# Patient Record
Sex: Female | Born: 1990
Health system: Southern US, Community
[De-identification: ages and names within clinical notes are randomized; demographics above are authoritative.]

## PROBLEM LIST (undated history)

## (undated) DIAGNOSIS — A749 Chlamydial infection, unspecified: Principal | ICD-10-CM

## (undated) DIAGNOSIS — Z8619 Personal history of other infectious and parasitic diseases: Secondary | ICD-10-CM

## (undated) HISTORY — DX: Chlamydial infection, unspecified: A74.9

## (undated) HISTORY — DX: Personal history of other infectious and parasitic diseases: Z86.19

---
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
## 2012-06-04 LAB — OB RESULTS CONSOLE ANTIBODY SCREEN: Antibody Screen: NEGATIVE

## 2012-06-04 LAB — OB RESULTS CONSOLE HEPATITIS B SURFACE ANTIGEN: Hepatitis B Surface Ag: NEGATIVE

## 2012-06-04 LAB — OB RESULTS CONSOLE VARICELLA ZOSTER ANTIBODY, IGG: Varicella: IMMUNE

## 2012-06-04 LAB — OB RESULTS CONSOLE ABO/RH: RH Type: POSITIVE

## 2012-06-04 LAB — OB RESULTS CONSOLE RUBELLA ANTIBODY, IGM: Rubella: IMMUNE

## 2012-06-04 LAB — OB RESULTS CONSOLE RPR: RPR: NONREACTIVE

## 2012-06-04 LAB — OB RESULTS CONSOLE HIV ANTIBODY (ROUTINE TESTING): HIV: NONREACTIVE

## 2012-06-04 LAB — OB RESULTS CONSOLE GC/CHLAMYDIA
Chlamydia: POSITIVE
Gonorrhea: NEGATIVE

## 2012-06-04 LAB — CYSTIC FIBROSIS DIAGNOSTIC STUDY: Interpretation-CFDNA:: NEGATIVE

## 2012-07-17 NOTE — L&D Delivery Note (Signed)
Delivery Note  Delivery team presented to pt bedside after variable decels with contractions noted on monitor. Pt attempted several pushes w/o significant movement of the infant, though she did make progress. Fetal Hr continued to show variable decels with contractions with recovery to 90-100s between contractions.   Decision was made to utilize kiwi vacuum for expediting delivery.  Dr Penne Lash was consulted to come for delivery.  Kiwi vacuum was applied to vertex and checked to assure application.   After 4 pulls of the vacuum w/ no pop offs and at 11:54 AM a viable female was delivered via Vaginal, Vacuum (Extractor) (Presentation: ; Occiput Posterior).    Dr. Penne Lash was present for the delivery. Cord was clamped and cut and baby was taken to warmer where RN assessed condition. Baby cried spontaneously and was vigorous.   APGAR: 8, 9; weight pending .   Placenta status: Intact, Spontaneous.  Cord: 3 vessels with the following complications: None.  Cord pH: pending  Anesthesia: Epidural  Episiotomy: None Lacerations: 1st degree midline perineal Suture Repair: 3.0 vicryl Est. Blood Loss (mL): 450  Mom to postpartum.  Baby to nursery-stable.  MERRELL, DAVID, MD Family Medicine Resident PGY-2 10/28/2012, 12:27 PM  I applied the vacuum and performed the delivery of the head, with Dr Penne Lash supervising. Then Dr Margot Ables completed the delivery without incident.  Agree with note.   Wynelle Bourgeois CNM   Vacuum was already applied to head upon my arrival to the room.  Baby delivered shortly thereafter (one to two pushes).  Daven Pinckney H.

## 2012-09-18 ENCOUNTER — Encounter: Payer: Self-pay | Admitting: *Deleted

## 2012-10-16 ENCOUNTER — Other Ambulatory Visit: Payer: Self-pay

## 2012-10-16 ENCOUNTER — Encounter: Payer: Self-pay | Admitting: Obstetrics & Gynecology

## 2012-10-16 ENCOUNTER — Encounter: Payer: Self-pay | Admitting: *Deleted

## 2012-10-17 ENCOUNTER — Encounter: Payer: Self-pay | Admitting: *Deleted

## 2012-10-17 ENCOUNTER — Encounter: Payer: Self-pay | Admitting: Obstetrics & Gynecology

## 2012-10-17 ENCOUNTER — Other Ambulatory Visit: Payer: Self-pay

## 2012-10-22 ENCOUNTER — Ambulatory Visit (INDEPENDENT_AMBULATORY_CARE_PROVIDER_SITE_OTHER): Payer: Medicaid Other

## 2012-10-22 ENCOUNTER — Other Ambulatory Visit: Payer: Self-pay | Admitting: Advanced Practice Midwife

## 2012-10-22 ENCOUNTER — Ambulatory Visit (INDEPENDENT_AMBULATORY_CARE_PROVIDER_SITE_OTHER): Payer: Medicaid Other | Admitting: Advanced Practice Midwife

## 2012-10-22 VITALS — BP 144/80 | Wt 155.6 lb

## 2012-10-22 DIAGNOSIS — Z1389 Encounter for screening for other disorder: Secondary | ICD-10-CM

## 2012-10-22 DIAGNOSIS — O288 Other abnormal findings on antenatal screening of mother: Secondary | ICD-10-CM

## 2012-10-22 DIAGNOSIS — O365931 Maternal care for other known or suspected poor fetal growth, third trimester, fetus 1: Secondary | ICD-10-CM

## 2012-10-22 DIAGNOSIS — O4100X0 Oligohydramnios, unspecified trimester, not applicable or unspecified: Secondary | ICD-10-CM

## 2012-10-22 DIAGNOSIS — IMO0002 Reserved for concepts with insufficient information to code with codable children: Secondary | ICD-10-CM | POA: Insufficient documentation

## 2012-10-22 DIAGNOSIS — Z3490 Encounter for supervision of normal pregnancy, unspecified, unspecified trimester: Secondary | ICD-10-CM

## 2012-10-22 DIAGNOSIS — O4103X1 Oligohydramnios, third trimester, fetus 1: Secondary | ICD-10-CM

## 2012-10-22 DIAGNOSIS — O365990 Maternal care for other known or suspected poor fetal growth, unspecified trimester, not applicable or unspecified: Secondary | ICD-10-CM

## 2012-10-22 DIAGNOSIS — Z3480 Encounter for supervision of other normal pregnancy, unspecified trimester: Secondary | ICD-10-CM

## 2012-10-22 DIAGNOSIS — O0933 Supervision of pregnancy with insufficient antenatal care, third trimester: Secondary | ICD-10-CM

## 2012-10-22 DIAGNOSIS — O0930 Supervision of pregnancy with insufficient antenatal care, unspecified trimester: Secondary | ICD-10-CM

## 2012-10-22 DIAGNOSIS — Z331 Pregnant state, incidental: Secondary | ICD-10-CM

## 2012-10-22 LAB — POCT URINALYSIS DIPSTICK
Glucose, UA: NEGATIVE
Ketones, UA: NEGATIVE
Nitrite, UA: NEGATIVE
Protein, UA: NEGATIVE

## 2012-10-22 LAB — OB RESULTS CONSOLE GC/CHLAMYDIA
CHLAMYDIA, DNA PROBE: NEGATIVE
Gonorrhea: NEGATIVE

## 2012-10-22 NOTE — Progress Notes (Signed)
C/o pressure in pelvic area and [employer]

## 2012-10-22 NOTE — Progress Notes (Signed)
U/S-37+6wks-vtx active fetus BPP 8/8, Mild Olgo. AFI=4.8cm, ant gr 2 plac, IUGR EFW 5 lb 11 oz (2589 gms) 7th%tile, UA Doppler RI=.66 & .14, female fetus [place]")

## 2012-10-22 NOTE — Assessment & Plan Note (Addendum)
Clinic:[facility]  Genetic Screen NT:                            First Screen:               Quad Screen/MSAFP:normal  Anatomic [place] normal  Glucose Screen 4/10  GBS 4/8  Feeding Preference breast  Contraception depo  Circumcision Yes @ [facility]

## 2012-10-22 NOTE — Progress Notes (Signed)
Hasn't had care since January (transportation issues).  Size<dates.  U/S done today for EFW (see note)  B/P up a little.  Denies HA, visual changes, RUQ pain. Will check PreX labs.  Routine questions about pregnancy answered.  F/U Thurs for PN2 (one hour since appt is in afternoon), NST and B/P recheck.  Discussed with Dr Despina Hidden

## 2012-10-23 ENCOUNTER — Telehealth: Payer: Self-pay | Admitting: Adult Health

## 2012-10-23 LAB — US OB FOLLOW UP

## 2012-10-23 LAB — CBC
HCT: 27.5 % — ABNORMAL LOW (ref 36.0–46.0)
Hemoglobin: 9.4 g/dL — ABNORMAL LOW (ref 12.0–15.0)
MCH: 27.7 pg (ref 26.0–34.0)
MCHC: 34.2 g/dL (ref 30.0–36.0)
MCV: 81.1 fL (ref 78.0–100.0)
Platelets: 368 10*3/uL (ref 150–400)
RBC: 3.39 MIL/uL — ABNORMAL LOW (ref 3.87–5.11)
RDW: 14.1 % (ref 11.5–15.5)
WBC: 6.7 10*3/uL (ref 4.0–10.5)

## 2012-10-23 LAB — COMPREHENSIVE METABOLIC PANEL
ALT: 18 U/L (ref 0–35)
AST: 26 U/L (ref 0–37)
Albumin: 3.2 g/dL — ABNORMAL LOW (ref 3.5–5.2)
Alkaline Phosphatase: 126 U/L — ABNORMAL HIGH (ref 39–117)
BUN: 3 mg/dL — ABNORMAL LOW (ref 6–23)
CO2: 28 mEq/L (ref 19–32)
Calcium: 8.7 mg/dL (ref 8.4–10.5)
Chloride: 105 mEq/L (ref 96–112)
Creat: 0.53 mg/dL (ref 0.50–1.10)
Glucose, Bld: 74 mg/dL (ref 70–99)
Potassium: 2.8 mEq/L — ABNORMAL LOW (ref 3.5–5.3)
Sodium: 140 mEq/L (ref 135–145)
Total Bilirubin: 0.3 mg/dL (ref 0.3–1.2)
Total Protein: 5.8 g/dL — ABNORMAL LOW (ref 6.0–8.3)

## 2012-10-23 LAB — PROTEIN / CREATININE RATIO, URINE
Creatinine, Urine: 27.2 mg/dL
Protein Creatinine Ratio: 0.11 (ref <0.15)
Total Protein, Urine: 3 mg/dL

## 2012-10-23 LAB — GC/CHLAMYDIA PROBE AMP
CT Probe RNA: NEGATIVE
GC Probe RNA: NEGATIVE

## 2012-10-23 NOTE — Telephone Encounter (Signed)
Called pt. Regarding labs. Potassium 2.8 told to get OTC potassium and take 2-3 x daily  And HGB 9.4 to take Prenatal vitamin daily and get OTC Iron supplement and take daily. Keep appointment for 4/10 @2 :45 pm. Discussed with Dr. Despina Hidden.

## 2012-10-24 ENCOUNTER — Other Ambulatory Visit: Payer: Medicaid Other

## 2012-10-24 ENCOUNTER — Encounter: Payer: Medicaid Other | Admitting: Obstetrics & Gynecology

## 2012-10-25 ENCOUNTER — Ambulatory Visit (INDEPENDENT_AMBULATORY_CARE_PROVIDER_SITE_OTHER): Payer: Medicaid Other | Admitting: Obstetrics & Gynecology

## 2012-10-25 ENCOUNTER — Other Ambulatory Visit: Payer: Medicaid Other

## 2012-10-25 VITALS — BP 118/88 | Wt 154.0 lb

## 2012-10-25 DIAGNOSIS — O365990 Maternal care for other known or suspected poor fetal growth, unspecified trimester, not applicable or unspecified: Secondary | ICD-10-CM

## 2012-10-25 DIAGNOSIS — O4100X0 Oligohydramnios, unspecified trimester, not applicable or unspecified: Secondary | ICD-10-CM

## 2012-10-25 DIAGNOSIS — Z331 Pregnant state, incidental: Secondary | ICD-10-CM

## 2012-10-25 DIAGNOSIS — O0930 Supervision of pregnancy with insufficient antenatal care, unspecified trimester: Secondary | ICD-10-CM

## 2012-10-25 DIAGNOSIS — O0990 Supervision of high risk pregnancy, unspecified, unspecified trimester: Secondary | ICD-10-CM | POA: Insufficient documentation

## 2012-10-25 DIAGNOSIS — Z1389 Encounter for screening for other disorder: Secondary | ICD-10-CM

## 2012-10-25 LAB — POCT URINALYSIS DIPSTICK
Blood, UA: NEGATIVE
Glucose, UA: NEGATIVE
Ketones, UA: NEGATIVE
Nitrite, UA: NEGATIVE
Protein, UA: NEGATIVE

## 2012-10-25 LAB — CBC
HCT: 27.1 % — ABNORMAL LOW (ref 36.0–46.0)
Hemoglobin: 9 g/dL — ABNORMAL LOW (ref 12.0–15.0)
MCH: 28.1 pg (ref 26.0–34.0)
MCHC: 33.2 g/dL (ref 30.0–36.0)
MCV: 84.7 fL (ref 78.0–100.0)
Platelets: 361 10*3/uL (ref 150–400)
RBC: 3.2 MIL/uL — ABNORMAL LOW (ref 3.87–5.11)
RDW: 14.4 % (ref 11.5–15.5)
WBC: 6.4 10*3/uL (ref 4.0–10.5)

## 2012-10-25 NOTE — Progress Notes (Signed)
C/o pelvic pressure and braxton hicks.

## 2012-10-25 NOTE — Patient Instructions (Signed)
Epidural Risks and Benefits The continuous putting in (infusion) of local anesthetics through a long, narrow, hollow plastic tube (catheter)/needle into the lower (lumbar) area of your spine is commonly called an epidural. This means outside the covering of the spinal cord. The epidural catheter is placed in the space on the outside of the membrane that covers the spinal cord. The anesthetic medicine numbs the nerves of the spinal cord in the epidural space. There is also a spinal/epidural anesthetic using two needles and a catheter. The medication is first placed in the spinal canal. Then that needle is removed and a catheter is placed in the epidural space through the second needle for continuous anesthesia. This seems to be the most popular type of regional anesthesia used now. This is sometimes given for pain management to women who are giving birth. Spinal and epidural anesthesia are called regional anesthesia because they numb a certain region of the body. While it is an effective pain management tool, some reasons not to use this include:  Restricted mobility: The tubes and monitors connected to you do not allow for much moving around.  Increased likelihood of bladder catheterization, oxytocin administration, and internal monitoring. This means a tube (catheter) may have to be put into the bladder to drain the urine. Uterine contractions can become weaker and less frequent. They also may have a higher use of oxytocin than mothers not having regional anesthesia.  Increased likelihood of operative delivery: This includes the use of or need for forceps, vacuum extractor, episiotomy, or cesarean delivery. When the dose is too large, or when it sinks down into the "tailbone" (sacral) region of the body, the perineum and the birth canal (vagina) are anesthetized. Anesthetic is injected into this area late in labor to deaden all sensation. When it "accidentally" happens earlier in labor, the muscles of the  pelvic floor are relaxed too early. This interferes with the normal flexion and rotation of the baby's head as it passes through the birth canal. This interference can lead to abnormal presentations that are more dangerous for the baby.  Must use an automatic blood pressure cuff throughout labor. This is a cuff that automatically takes your blood pressure at regular intervals. SHORT TERM MATERNAL RISKS  Dural puncture - The dura is one of the membranes surrounding the spinal cord. If the anesthetic medication gets into the spinal canal through a dural puncture, it can result in a spinal anesthetic and spinal headache. Spinal headaches are treated with an epidural blood patch to cover the punctured area.  Low blood pressure (hypotension) - Nearly one third of women with an epidural will develop low blood pressure. The ways that patients must lay during the epidural can make this worse. Their position is limited because they will be unable to move their legs easily for the time of the anesthetic. Low blood pressure is also a risk for the baby. If the baby does not get enough oxygen from the mom's blood, it can result in an emergency Cesarean section. This means the baby is delivered by an operation through a cut by the surgeon (incision) on the belly of the mother.  Nausea, vomiting, and prolonged shivering.  Prolonged labor - With large doses of anesthetic medication, the patient loses the desire and the ability to bear down and push. This results in an increased use of forceps and vacuum extractions, compared to women having unmedicated deliveries.  Uneven, incomplete or non-existent pain relief. Sometimes the epidural does not work well and   additional medications may be needed for pain relief.  Difficulty breathing well or paralysis if the level of anesthesia goes too high in the spine.  Convulsions - If the anesthetic agent accidentally is injected into a blood vessel it can cause convulsions and  loss of consciousness.  Toxic drug reactions.  Septic meningitis - An abscess can form at the site where the epidural catheter is placed. If this spreads into the spinal canal it can cause meningitis.  Allergic reaction - This causes blood pressure to become too low and other medications and fluids must be given to bring the blood pressure up. Also rashes and difficulty breathing may develop.  Cardiac arrest - This is rare but real threat to the life of the mother and baby.  Fever is common.  Itching that is easily treated.  Spinal hematoma. LONG TERM MATERNAL RISKS  Neurological complications - A nerve problem called Horner's syndrome can develop with epidural anesthesia for vaginal delivery. It is impossible to predict which patients will develop a Horner's syndrome. Even the nerves to the face can be blocked, temporarily or permanently. Tremors and shakes can occur.  Paresthesia ("pins and needles"). This is a feeling that comes from inflammation of a nerve.  Dizziness and fainting can become a problem after epidurals. This is usually only for a couple of days. RISKS TO BABY  Direct drug toxicity.  Fetal distress, abnormal fetal heart rate (FHR) (can lead to emergency cesarean). This is especially true if the anesthetic gets into the mother's blood stream or too much medication is put into the epidural. REASONS NOT TO HAVE EPIDURAL ANESTHESIA  Increased costs.  The mother has a low blood pressure.  There are blood clotting problems.  A brain tumor is present.  There is an infection in the blood stream.  A skin infection at the needle site.  A tattoo at the needle site. BENEFITS  Regional anesthesia is the most effective pain relief for labor and delivery.  It is the best anesthetic for preeclampsia and eclampsia.  There is better pain control after delivery (vaginal or cesarean).  When done correctly, no medication gets to the baby.  Sooner ambulation after  delivery.  It can be left in place during all of labor.  You can be awake during a Cesarean delivery and see the baby immediately after delivery. AFTER THE PROCEDURE   You will be kept in bed for several hours to prevent headaches.  You will be kept in bed until your legs are no longer numb and it is safe to walk.  The length of time you spend in the hospital will depend on the type of surgery or procedure you have had.  The epidural catheter is removed after you no longer need it for pain. HOME CARE INSTRUCTIONS   Do not drive or operate any kind of machinery for at least 24 hours. Make sure there is someone to drive you home.  Do not drink alcohol for at least 24 hours after the anesthesia.  Do not make important decisions for at least 24 hours after the anesthesia.  Drink lots of fluids.  Return to your normal diet.  Keep all your postoperative appointments as scheduled. SEEK IMMEDIATE MEDICAL CARE IF:  You develop a fever or temperature over 98.6 F (37 C).  You have a persistent headache.  You develop dizziness, fainting or lightheadedness.  You develop weakness, numbness or tingling in your arms or legs.  You have a skin rash.  You   have difficulty breathing  You have a stiff neck with or without stiff back.  You develop chest pain. Document Released: 07/03/2005 Document Revised: 09/25/2011 Document Reviewed: 08/10/2008 ExitCare Patient Information 2013 [employer].  

## 2012-10-25 NOTE — Progress Notes (Signed)
Reactive NST.  BP weight and urine results all reviewed and noted. Patient reports good fetal movement, denies any bleeding and no rupture of membranes symptoms or regular contractions. Patient is without complaints. All questions were answered. Sonogram on Tues 4.15.2014

## 2012-10-26 LAB — RPR

## 2012-10-26 LAB — GLUCOSE TOLERANCE, 1 HOUR

## 2012-10-26 LAB — ANTIBODY SCREEN: Antibody Screen: NEGATIVE

## 2012-10-26 LAB — HIV ANTIBODY (ROUTINE TESTING W REFLEX): HIV: NONREACTIVE

## 2012-10-28 ENCOUNTER — Inpatient Hospital Stay (HOSPITAL_COMMUNITY): Payer: Medicaid Other | Admitting: Anesthesiology

## 2012-10-28 ENCOUNTER — Inpatient Hospital Stay (HOSPITAL_COMMUNITY)
Admission: AD | Admit: 2012-10-28 | Discharge: 2012-10-30 | DRG: 775 | Disposition: A | Discharge status: Home / Self Care | Payer: Medicaid Other | Source: Ambulatory Visit | Attending: Obstetrics and Gynecology | Admitting: Obstetrics and Gynecology

## 2012-10-28 ENCOUNTER — Encounter (HOSPITAL_COMMUNITY): Payer: Self-pay

## 2012-10-28 ENCOUNTER — Encounter (HOSPITAL_COMMUNITY): Payer: Self-pay | Admitting: Anesthesiology

## 2012-10-28 DIAGNOSIS — O365990 Maternal care for other known or suspected poor fetal growth, unspecified trimester, not applicable or unspecified: Secondary | ICD-10-CM | POA: Diagnosis present

## 2012-10-28 DIAGNOSIS — O0990 Supervision of high risk pregnancy, unspecified, unspecified trimester: Secondary | ICD-10-CM

## 2012-10-28 DIAGNOSIS — IMO0002 Reserved for concepts with insufficient information to code with codable children: Secondary | ICD-10-CM

## 2012-10-28 DIAGNOSIS — O288 Other abnormal findings on antenatal screening of mother: Secondary | ICD-10-CM

## 2012-10-28 DIAGNOSIS — O4100X0 Oligohydramnios, unspecified trimester, not applicable or unspecified: Secondary | ICD-10-CM

## 2012-10-28 DIAGNOSIS — O0930 Supervision of pregnancy with insufficient antenatal care, unspecified trimester: Secondary | ICD-10-CM

## 2012-10-28 LAB — CBC
Hemoglobin: 8.9 g/dL — ABNORMAL LOW (ref 12.0–15.0)
MCH: 28.7 pg (ref 26.0–34.0)
MCHC: 33.6 g/dL (ref 30.0–36.0)
Platelets: 289 10*3/uL (ref 150–400)

## 2012-10-28 LAB — HSV 2 ANTIBODY, IGG: HSV 2 Glycoprotein G Ab, IgG: <0.1 IV

## 2012-10-28 LAB — RPR: RPR Ser Ql: NONREACTIVE

## 2012-10-28 MED ORDER — DIPHENHYDRAMINE HCL 25 MG PO CAPS: 25.0000 mg | ORAL_CAPSULE | Freq: Four times a day (QID) | ORAL | Status: DC | PRN

## 2012-10-28 MED ORDER — LIDOCAINE HCL (PF) 1 % IJ SOLN: 30.0000 mL | INTRAMUSCULAR | Status: DC | PRN

## 2012-10-28 MED ORDER — LACTATED RINGERS IV SOLN
500.0000 mL | Freq: Once | INTRAVENOUS | Status: AC
  Administered 2012-10-28: 500 mL via INTRAVENOUS

## 2012-10-28 MED ORDER — ONDANSETRON HCL 4 MG/2ML IJ SOLN: 4.0000 mg | Freq: Four times a day (QID) | INTRAMUSCULAR | Status: DC | PRN

## 2012-10-28 MED ORDER — ACETAMINOPHEN 325 MG PO TABS: 650.0000 mg | ORAL_TABLET | ORAL | Status: DC | PRN

## 2012-10-28 MED ORDER — FENTANYL 2.5 MCG/ML BUPIVACAINE 1/10 % EPIDURAL INFUSION (WH - ANES)
INTRAMUSCULAR | Status: DC | PRN
  Administered 2012-10-28: 14 mL/h via EPIDURAL

## 2012-10-28 MED ORDER — FENTANYL 2.5 MCG/ML BUPIVACAINE 1/10 % EPIDURAL INFUSION (WH - ANES): 14.0000 mL/h | INTRAMUSCULAR | Status: DC | PRN

## 2012-10-28 MED ORDER — LIDOCAINE HCL (PF) 1 % IJ SOLN
INTRAMUSCULAR | Status: DC | PRN
  Administered 2012-10-28 (×2): 4 mL

## 2012-10-28 MED ORDER — WITCH HAZEL-GLYCERIN EX PADS: 1.0000 "application " | MEDICATED_PAD | CUTANEOUS | Status: DC | PRN

## 2012-10-28 MED ORDER — FENTANYL 2.5 MCG/ML BUPIVACAINE 1/10 % EPIDURAL INFUSION (WH - ANES)
14.0000 mL/h | INTRAMUSCULAR | Status: DC | PRN
  Filled 2012-10-28: qty 125

## 2012-10-28 MED ORDER — SENNOSIDES-DOCUSATE SODIUM 8.6-50 MG PO TABS
2.0000 | ORAL_TABLET | Freq: Every day | ORAL | Status: DC
  Administered 2012-10-28 – 2012-10-29 (×2): 2 via ORAL

## 2012-10-28 MED ORDER — OXYTOCIN BOLUS FROM INFUSION: 500.0000 mL | INTRAVENOUS | Status: DC

## 2012-10-28 MED ORDER — OXYTOCIN 40 UNITS IN LACTATED RINGERS INFUSION - SIMPLE MED
62.5000 mL/h | INTRAVENOUS | Status: DC
  Filled 2012-10-28: qty 1000

## 2012-10-28 MED ORDER — CITRIC ACID-SODIUM CITRATE 334-500 MG/5ML PO SOLN: 30.0000 mL | ORAL | Status: DC | PRN

## 2012-10-28 MED ORDER — ZOLPIDEM TARTRATE 5 MG PO TABS: 5.0000 mg | ORAL_TABLET | Freq: Every evening | ORAL | Status: DC | PRN

## 2012-10-28 MED ORDER — PRENATAL MULTIVITAMIN CH
1.0000 | ORAL_TABLET | Freq: Every day | ORAL | Status: DC
  Administered 2012-10-28: 1 via ORAL
  Filled 2012-10-28 (×2): qty 1

## 2012-10-28 MED ORDER — OXYCODONE-ACETAMINOPHEN 5-325 MG PO TABS: 1.0000 | ORAL_TABLET | ORAL | Status: DC | PRN

## 2012-10-28 MED ORDER — LANOLIN HYDROUS EX OINT: TOPICAL_OINTMENT | CUTANEOUS | Status: DC | PRN

## 2012-10-28 MED ORDER — ONDANSETRON HCL 4 MG PO TABS: 4.0000 mg | ORAL_TABLET | ORAL | Status: DC | PRN

## 2012-10-28 MED ORDER — NALBUPHINE SYRINGE 5 MG/0.5 ML: 5.0000 mg | INJECTION | INTRAMUSCULAR | Status: DC | PRN

## 2012-10-28 MED ORDER — TETANUS-DIPHTH-ACELL PERTUSSIS 5-2.5-18.5 LF-MCG/0.5 IM SUSP
0.5000 mL | Freq: Once | INTRAMUSCULAR | Status: AC
  Administered 2012-10-29: 0.5 mL via INTRAMUSCULAR
  Filled 2012-10-28: qty 0.5

## 2012-10-28 MED ORDER — DIPHENHYDRAMINE HCL 50 MG/ML IJ SOLN: 12.5000 mg | INTRAMUSCULAR | Status: DC | PRN

## 2012-10-28 MED ORDER — EPHEDRINE 5 MG/ML INJ
10.0000 mg | INTRAVENOUS | Status: DC | PRN
  Filled 2012-10-28: qty 4

## 2012-10-28 MED ORDER — IBUPROFEN 600 MG PO TABS
600.0000 mg | ORAL_TABLET | Freq: Four times a day (QID) | ORAL | Status: DC
  Administered 2012-10-28 – 2012-10-30 (×7): 600 mg via ORAL
  Filled 2012-10-28 (×7): qty 1

## 2012-10-28 MED ORDER — PHENYLEPHRINE 40 MCG/ML (10ML) SYRINGE FOR IV PUSH (FOR BLOOD PRESSURE SUPPORT): 80.0000 ug | PREFILLED_SYRINGE | INTRAVENOUS | Status: DC | PRN

## 2012-10-28 MED ORDER — EPHEDRINE 5 MG/ML INJ: 10.0000 mg | INTRAVENOUS | Status: DC | PRN

## 2012-10-28 MED ORDER — DIBUCAINE 1 % RE OINT
1.0000 "application " | TOPICAL_OINTMENT | RECTAL | Status: DC | PRN
  Filled 2012-10-28: qty 28

## 2012-10-28 MED ORDER — LACTATED RINGERS IV SOLN
INTRAVENOUS | Status: DC
  Administered 2012-10-28: 10:00:00 via INTRAVENOUS

## 2012-10-28 MED ORDER — BENZOCAINE-MENTHOL 20-0.5 % EX AERO
1.0000 "application " | INHALATION_SPRAY | CUTANEOUS | Status: DC | PRN
  Administered 2012-10-30: 1 via TOPICAL
  Filled 2012-10-28: qty 56

## 2012-10-28 MED ORDER — LACTATED RINGERS IV SOLN: 500.0000 mL | INTRAVENOUS | Status: DC | PRN

## 2012-10-28 MED ORDER — SIMETHICONE 80 MG PO CHEW: 80.0000 mg | CHEWABLE_TABLET | ORAL | Status: DC | PRN

## 2012-10-28 MED ORDER — IBUPROFEN 600 MG PO TABS: 600.0000 mg | ORAL_TABLET | Freq: Four times a day (QID) | ORAL | Status: DC | PRN

## 2012-10-28 MED ORDER — ONDANSETRON HCL 4 MG/2ML IJ SOLN: 4.0000 mg | INTRAMUSCULAR | Status: DC | PRN

## 2012-10-28 MED ORDER — PHENYLEPHRINE 40 MCG/ML (10ML) SYRINGE FOR IV PUSH (FOR BLOOD PRESSURE SUPPORT)
80.0000 ug | PREFILLED_SYRINGE | INTRAVENOUS | Status: DC | PRN
  Filled 2012-10-28 (×2): qty 5

## 2012-10-28 NOTE — Anesthesia Preprocedure Evaluation (Signed)

## 2012-10-28 NOTE — Anesthesia Procedure Notes (Signed)
Epidural Patient location during procedure: OB Start time: 10/28/2012 7:47 AM  Staffing Anesthesiologist: Damani Kelemen A. Performed by: anesthesiologist   Preanesthetic Checklist Completed: patient identified, site marked, surgical consent, pre-op evaluation, timeout performed, IV checked, risks and benefits discussed and monitors and equipment checked  Epidural Patient position: sitting Prep: site prepped and draped and DuraPrep Patient monitoring: continuous pulse ox and blood pressure Approach: midline Injection technique: LOR air  Needle:  Needle type: Tuohy  Needle gauge: 17 G Needle length: 9 cm and 9 Needle insertion depth: 4 cm Catheter type: closed end flexible Catheter size: 19 Gauge Catheter at skin depth: 9 cm Test dose: negative and Other  Assessment Events: blood not aspirated, injection not painful, no injection resistance, negative IV test and no paresthesia  Additional Notes Patient identified. Risks and benefits discussed including failed block, incomplete  Pain control, post dural puncture headache, nerve damage, paralysis, blood pressure Changes, nausea, vomiting, reactions to medications-both toxic and allergic and post Partum back pain. All questions were answered. Patient expressed understanding and wished to proceed. Sterile technique was used throughout procedure. Epidural site was Dressed with sterile barrier dressing. No paresthesias, signs of intravascular injection Or signs of intrathecal spread were encountered.  Patient was more comfortable after the epidural was dosed. Please see RN's note for documentation of vital signs and FHR which are stable.

## 2012-10-29 ENCOUNTER — Other Ambulatory Visit: Payer: Medicaid Other

## 2012-10-29 ENCOUNTER — Encounter: Payer: Medicaid Other | Admitting: Obstetrics & Gynecology

## 2012-10-29 LAB — CBC
HCT: 24.2 % — ABNORMAL LOW (ref 36.0–46.0)
MCH: 28.8 pg (ref 26.0–34.0)
MCV: 86.1 fL (ref 78.0–100.0)
Platelets: 285 10*3/uL (ref 150–400)
RBC: 2.81 MIL/uL — ABNORMAL LOW (ref 3.87–5.11)

## 2012-10-29 MED ORDER — FERROUS SULFATE 325 (65 FE) MG PO TABS
325.0000 mg | ORAL_TABLET | Freq: Every day | ORAL | Status: DC
  Administered 2012-10-29 – 2012-10-30 (×2): 325 mg via ORAL
  Filled 2012-10-29 (×2): qty 1

## 2012-10-29 NOTE — Progress Notes (Signed)
Post Partum Day 1 Subjective: no complaints, up ad lib, voiding and tolerating PO  Objective: Blood pressure 120/67, pulse 77, temperature 97.9 F (36.6 C), temperature source Oral, resp. rate 18, height 5\' 5"  (1.651 m), weight 157 lb (71.215 kg), SpO2 99.00%, unknown if currently breastfeeding.  Physical Exam:  General: alert, cooperative and no distress Lochia: appropriate Uterine Fundus: firm Incision: n/a DVT Evaluation: No evidence of DVT seen on physical exam. Negative Homan's sign. No cords or calf tenderness.   Recent Labs  10/28/12 0650 10/29/12 0648  HGB 8.9* 8.1*  HCT 26.5* 24.2*    Assessment/Plan: Plan for discharge tomorrow, Breastfeeding, Lactation consult, Social Work consult and Contraception Nexplanon at [facility] for anemia   LOS: 1 day   Corgan Mormile H. 10/29/2012, 7:36 AM

## 2012-10-29 NOTE — Anesthesia Postprocedure Evaluation (Signed)
  Anesthesia Post-op Note  Patient: [name] Wood  Procedure(s) Performed: * No procedures listed *  Patient Location: PACU and Mother/Baby  Anesthesia Type:Epidural  Level of Consciousness: awake, alert  and oriented  Airway and Oxygen Therapy: Patient Spontanous Breathing  Post-op Pain: none  Post-op Assessment: Post-op Vital signs reviewed, Patient's Cardiovascular Status Stable, No headache, No backache, No residual numbness and No residual motor weakness  Post-op Vital Signs: Reviewed and stable  Complications: No apparent anesthesia complications

## 2012-10-29 NOTE — Progress Notes (Signed)
UR chart review completed.  

## 2012-10-29 NOTE — H&P (Signed)
[name] Wood is a 22 y.o. female presenting for Labor. Was scheduled to be induced this week for Oligo and IUGR. . Maternal Medical History:  Reason for admission: Contractions.  Nausea.  Contractions: Onset was 1-2 hours ago.   Frequency: regular.    Fetal activity: Perceived fetal activity is normal.    Prenatal complications: IUGR and oligohydramnios.     OB History   Grav Para Term Preterm Abortions TAB SAB Ect Mult Living   1 1 1       1      Past Medical History  Diagnosis Date  . History of chlamydia     Pt and partner was treated.   History reviewed. No pertinent past surgical history. Family History: family history includes Coronary artery disease in her other; Diabetes in her other; Hypertension in her other; and Thyroid disease in her other. Social History:  reports that she has never smoked. She does not have any smokeless tobacco history on file. She reports that she does not drink alcohol or use illicit drugs.   Prenatal Transfer Tool  Maternal Diabetes: No Genetic Screening: Normal Maternal Ultrasounds/Referrals: Normal Fetal Ultrasounds or other Referrals:  None Maternal Substance Abuse:  No Significant Maternal Medications:  None Significant Maternal Lab Results:  None Other Comments:  None  Review of Systems  Constitutional: Negative for fever and malaise/fatigue.  Gastrointestinal: Positive for abdominal pain. Negative for nausea, vomiting, diarrhea and constipation.    Dilation: 10 Effacement (%): 100 Station: +2 Exam by:: D herr rn Blood pressure 120/67, pulse 77, temperature 97.9 F (36.6 C), temperature source Oral, resp. rate 18, height 5\' 5"  (1.651 m), weight 157 lb (71.215 kg), SpO2 99.00%, unknown if currently breastfeeding. Maternal Exam:  Uterine Assessment: Contraction strength is firm.  Contraction frequency is regular.   Abdomen: Fundal height is 36.   Estimated fetal weight is 6lb.   Fetal presentation: vertex  Introitus:  Normal vulva. Normal vagina.  Vagina is negative for discharge.  Ferning test: not done.  Nitrazine test: not done. Amniotic fluid character: not assessed.  Pelvis: adequate for delivery.   Cervix: Cervix evaluated by digital exam.     Fetal Exam Fetal Monitor Review: Mode: ultrasound.   Baseline rate: 140.  Variability: moderate (6-25 bpm).   Pattern: accelerations present and no decelerations.    Fetal State Assessment: Category I - tracings are normal.    Cervix 5cm  Physical Exam  Constitutional: She is oriented to person, place, and time. She appears well-developed and well-nourished.  HENT:  Head: Normocephalic.  Cardiovascular: Normal rate.   Respiratory: Effort normal.  GI: Soft. She exhibits no distension. There is no tenderness. There is no rebound and no guarding.  Genitourinary: Vagina normal and uterus normal. No vaginal discharge found.  Musculoskeletal: Normal range of motion.  Neurological: She is alert and oriented to person, place, and time.  Skin: Skin is warm and dry.  Psychiatric: She has a normal mood and affect.    Prenatal labs: ABO, Rh: O/Positive/-- (11/19 0000) Antibody: NEG (04/11 1026) Rubella: Immune (11/19 0000) RPR: NON REACTIVE (04/14 0650)  HBsAg: Negative (11/19 0000)  HIV: NON REACTIVE (04/11 1026)  GBS: Negative (04/09 0000)   Assessment/Plan: A:  SIUP at [redacted]w[redacted]d       Labor, Active   P:  Admit to [employer]      Routine orders      Epidural   [facility] 10/29/2012, 4:18 PM

## 2012-10-30 MED ORDER — IBUPROFEN 600 MG PO TABS: 600.0000 mg | ORAL_TABLET | Freq: Four times a day (QID) | ORAL | Status: DC

## 2012-10-30 NOTE — Discharge Summary (Signed)
Obstetric Discharge Summary Reason for Admission: onset of labor Prenatal Procedures: NST and ultrasound; oligohydramnios and IUGR Intrapartum Procedures: vacuum Postpartum Procedures: none Complications-Operative and Postpartum: 1st degree perineal laceration Hemoglobin  Date Value Range Status  10/29/2012 8.1* 12.0 - 15.0 g/dL Final     HCT  Date Value Range Status  10/29/2012 24.2* 36.0 - 46.0 % Final    Physical Exam:   Filed Vitals:   10/30/12 0555  BP: 130/82  Pulse: 91  Temp: 98.4 F (36.9 C)  Resp: 20   General: alert, cooperative and appears stated age 22: appropriate Uterine Fundus: firm Incision: n/a DVT Evaluation: No evidence of DVT seen on physical exam. Negative Homan's sign.  Discharge Diagnoses: Term Pregnancy-delivered  Discharge Information: Date: 10/30/2012 Activity: pelvic rest Diet: routine Medications: Ibuprofen Condition: stable Instructions: refer to practice specific booklet Discharge to: home Follow-up Information   Follow up with [facility] In 6 weeks.   Contact information:   [address] [place] [address] [phone]     Desires nexplanon postpartum.    Newborn Data: Live born female  Birth Weight: 6 lb (2722 g) APGAR: 8, 9  Home with mother.  [facility] 10/30/2012, 6:13 AM

## 2012-10-30 NOTE — H&P (Signed)
Agree with above note.  LEGGETT,KELLY H. 10/30/2012 9:33 AM

## 2012-10-30 NOTE — Progress Notes (Signed)
Pt discharged before CSW could assess history of MJ use & "insufficent PNC."  CSW will monitor drug screen results & make a CPS referral if necessary. 

## 2012-12-12 ENCOUNTER — Encounter: Payer: Self-pay | Admitting: *Deleted

## 2012-12-12 ENCOUNTER — Ambulatory Visit: Payer: Medicaid Other | Admitting: Advanced Practice Midwife

## 2013-07-17 NOTE — L&D Delivery Note (Signed)
Delivery Note At 9:30 PM a viable and healthy female was delivered via Vaginal, Spontaneous Delivery (Presentation: Left Occiput Anterior).  APGAR: 8, 9; weight 5 lb 4.4 oz (2393 g).   Placenta status: Intact, Spontaneous.  Cord: 3 vessels with the following complications: None.    Anesthesia: Epidural  Episiotomy: None Lacerations: None Est. Blood Loss (mL): 250  Mom to postpartum.  Baby to Couplet care / Skin to Skin.  Melissa Noon 12/07/2013, 11:32 PM

## 2013-07-17 NOTE — L&D Delivery Note (Signed)
Attestation of Attending Supervision of Advanced Practitioner (CNM/NP): Evaluation and management procedures were performed by the Advanced Practitioner under my supervision and collaboration.  I have reviewed the Advanced Practitioner's note and chart, and I agree with the management and plan.  Avelina Mcclurkin Harraway-Smith 9:14 AM

## 2013-07-24 ENCOUNTER — Encounter: Payer: Self-pay | Admitting: *Deleted

## 2013-09-04 ENCOUNTER — Encounter (INDEPENDENT_AMBULATORY_CARE_PROVIDER_SITE_OTHER): Payer: Self-pay

## 2013-09-29 ENCOUNTER — Emergency Department (HOSPITAL_COMMUNITY)
Admission: EM | Admit: 2013-09-29 | Discharge: 2013-09-29 | Disposition: A | Discharge status: Home / Self Care | Payer: Medicaid Other | Attending: Emergency Medicine | Admitting: Emergency Medicine

## 2013-09-29 ENCOUNTER — Emergency Department (HOSPITAL_COMMUNITY): Payer: Medicaid Other

## 2013-09-29 ENCOUNTER — Encounter (HOSPITAL_COMMUNITY): Payer: Self-pay | Admitting: Emergency Medicine

## 2013-09-29 DIAGNOSIS — R109 Unspecified abdominal pain: Secondary | ICD-10-CM | POA: Insufficient documentation

## 2013-09-29 DIAGNOSIS — Z3490 Encounter for supervision of normal pregnancy, unspecified, unspecified trimester: Secondary | ICD-10-CM

## 2013-09-29 DIAGNOSIS — Z8619 Personal history of other infectious and parasitic diseases: Secondary | ICD-10-CM | POA: Insufficient documentation

## 2013-09-29 DIAGNOSIS — O9989 Other specified diseases and conditions complicating pregnancy, childbirth and the puerperium: Secondary | ICD-10-CM | POA: Insufficient documentation

## 2013-09-29 LAB — URINALYSIS, ROUTINE W REFLEX MICROSCOPIC
Bilirubin Urine: NEGATIVE
Glucose, UA: NEGATIVE mg/dL
Hgb urine dipstick: NEGATIVE
KETONES UR: NEGATIVE mg/dL
NITRITE: NEGATIVE
PROTEIN: NEGATIVE mg/dL
Specific Gravity, Urine: 1.015 (ref 1.005–1.030)
Urobilinogen, UA: 0.2 mg/dL (ref 0.0–1.0)
pH: 7 (ref 5.0–8.0)

## 2013-09-29 LAB — URINE MICROSCOPIC-ADD ON

## 2013-09-29 LAB — POC URINE PREG, ED: PREG TEST UR: POSITIVE — AB

## 2013-09-29 MED ORDER — PRENATAL VITAMINS 0.8 MG PO TABS: 1.0000 | ORAL_TABLET | Freq: Every day | ORAL | Status: DC

## 2013-09-29 NOTE — Care Management Note (Signed)
ED/CM noted patient did not have health insurance and/or PCP listed in the computer.  [facility] [place] the [facility] [place] with information on the clinics, food pantries, and the handout for new health insurance sign-up.  Patient expressed appreciation for information received. Pt is pregnant and currently will be following with an OB/GYN

## 2013-09-29 NOTE — Progress Notes (Signed)
OB Rapid Response: pt admitted into [facility] for remote electronic fetal monitoring.

## 2013-09-29 NOTE — ED Notes (Signed)
Pt c/o of lower back cramping that radiates to the lower abdomen. Pt states it feels like "period cramps." Pt also states she is pregnant and has not been receiving any prenatal care, thus does not know how far along she is. Pt denies any N/V/D, any vaginal bleeding or discharge. Pt in no apparent distress.

## 2013-09-29 NOTE — ED Notes (Signed)
Spoke with RR RN at [facility].  Explained that EDP performed cervix check and states that cervix was intact.  nad noted

## 2013-09-29 NOTE — ED Provider Notes (Signed)
Pelvic exam reveals closed cervix.  No blood or d/c.   [facility]   Can d/c  Donnetta HutchingBrian Abed Schar, MD 09/29/13 1710

## 2013-09-29 NOTE — ED Provider Notes (Signed)
CSN: [id number]     Arrival date & time 09/29/13  1233 History   First MD Initiated Contact with Patient 09/29/13 1327     Chief Complaint  Patient presents with  . Abdominal Cramping      HPI Patient reports she has had back cramping for over 2 weeks She reports she thinks she is pregnant No actual abd pain/cramping (she told nursing abd cramping, tells me back cramping) No vag bleeding No dysuria No vag discharge No fever  She reports her course is worsening and nothing improves her symptoms   Past Medical History  Diagnosis Date  . History of chlamydia     Pt and partner was treated.   History reviewed. No pertinent past surgical history. Family History  Problem Relation Age of Onset  . Coronary artery disease Other   . Hypertension Other   . Diabetes Other   . Thyroid disease Other    History  Substance Use Topics  . Smoking status: Never Smoker   . Smokeless tobacco: Not on file  . Alcohol Use: No     Comment: none since positive preg. test.   OB History   Grav Para Term Preterm Abortions TAB SAB Ect Mult Living   1 1 1       1      Review of Systems  Constitutional: Negative for fever.  Gastrointestinal: Negative for vomiting.  Genitourinary: Negative for vaginal bleeding.  All other systems reviewed and are negative.      Allergies  Review of patient's allergies indicates no known allergies.  Home Medications  No current outpatient prescriptions on file. BP 112/60  Pulse 92  Temp(Src) 97.8 F (36.6 C) (Oral)  Resp 16  Ht 5\' 5"  (1.651 m)  Wt 137 lb 5 oz (62.285 kg)  BMI 22.85 kg/m2  SpO2 100% Physical Exam CONSTITUTIONAL: Well developed/well nourished HEAD: Normocephalic/atraumatic EYES: EOMI/PERRL ENMT: Mucous membranes moist NECK: supple no meningeal signs SPINE:entire spine nontender CV: S1/S2 noted, no murmurs/rubs/gallops noted LUNGS: Lungs are clear to auscultation bilaterally, no apparent distress ABDOMEN: soft,gravid,  nontender, no rebound or guarding GU:no cva tenderness NEURO: Pt is awake/alert, moves all extremitiesx4 EXTREMITIES: pulses normal, full ROM SKIN: warm, color normal PSYCH: no abnormalities of mood noted   ED Course  [place] bedside Date/Time: 09/29/2013 3:13 PM Performed by: Joya Gaskins Authorized by: Joya Gaskins Consent: Verbal consent obtained. Consent given by: patient Comments: abd US performed which showed IUP at approximately 29 weeks by BPD +fetal movement noted Images archived     3:14 PM Pt unsure of dates as she is breastfeeding for past year Bedside ultrasound shows IUP around 29 weeks Formal US ordered Will place on fetal monitoring but pt denies abd pain/vag bleeding at this time Pelvic exam deferred 4:02 PM D/w dr Adriana Simas to f/u on formal US imaging and also OB consult with fetal monitoring   Labs Review Labs Reviewed  URINALYSIS, ROUTINE W REFLEX MICROSCOPIC - Abnormal; Notable for the following:    APPearance HAZY (*)    Leukocytes, UA SMALL (*)    All other components within normal limits  URINE MICROSCOPIC-ADD ON - Abnormal; Notable for the following:    Squamous Epithelial / LPF FEW (*)    Bacteria, UA FEW (*)    All other components within normal limits  POC URINE PREG, ED - Abnormal; Notable for the following:    Preg Test, Ur POSITIVE (*)    All other components within normal limits  MDM   Final diagnoses:  None    Nursing notes including past medical history and social history reviewed and considered in documentation Labs/vital reviewed and considered     Joya Gaskinsonald W Dericka Ostenson, MD 09/29/13 [id number]

## 2013-09-29 NOTE — ED Notes (Signed)
Pt reports abdominal cramping x 2 weeks. Pt states that she had a positive at home pregnancy test 6 weeks ago but is unsure of due date. Pt has not received prenatal care.

## 2013-10-03 ENCOUNTER — Telehealth: Payer: Self-pay | Admitting: *Deleted

## 2013-10-03 ENCOUNTER — Other Ambulatory Visit: Payer: Self-pay | Admitting: Obstetrics & Gynecology

## 2013-10-03 DIAGNOSIS — O3680X0 Pregnancy with inconclusive fetal viability, not applicable or unspecified: Secondary | ICD-10-CM

## 2013-10-03 NOTE — Telephone Encounter (Signed)
Pt called with complaints of having chills, losing her breath, and having blackout spells. Pt hasn't been seen here yet, 1st appt is Tuesday. [facility]. Advised she needed to be seen by someone today. Pt voiced understanding. JSY

## 2013-10-07 ENCOUNTER — Other Ambulatory Visit: Payer: Self-pay | Admitting: Obstetrics & Gynecology

## 2013-10-07 ENCOUNTER — Ambulatory Visit (INDEPENDENT_AMBULATORY_CARE_PROVIDER_SITE_OTHER): Payer: Medicaid Other

## 2013-10-07 DIAGNOSIS — O3680X0 Pregnancy with inconclusive fetal viability, not applicable or unspecified: Secondary | ICD-10-CM

## 2013-10-07 DIAGNOSIS — O0930 Supervision of pregnancy with insufficient antenatal care, unspecified trimester: Secondary | ICD-10-CM

## 2013-10-07 NOTE — Progress Notes (Signed)
U/S(28+6wks?)-vtx active fetus, meas c/w previous u/s dates (from [facility]), FHR- 146 BPM, cx appears closed (3.1cm), posterior/fundal Gr 1 placenta, fluid wnl Single Deepest pocket-4.7cm, female fetus, bilateral adnexa appears WNL, pt will return for 1st Ob visit and detailed anatomy screen

## 2013-10-10 ENCOUNTER — Other Ambulatory Visit: Payer: Self-pay | Admitting: Obstetrics & Gynecology

## 2013-10-10 DIAGNOSIS — O0930 Supervision of pregnancy with insufficient antenatal care, unspecified trimester: Secondary | ICD-10-CM

## 2013-10-14 ENCOUNTER — Encounter: Payer: Self-pay | Admitting: Advanced Practice Midwife

## 2013-10-14 ENCOUNTER — Ambulatory Visit (INDEPENDENT_AMBULATORY_CARE_PROVIDER_SITE_OTHER): Payer: Medicaid Other | Admitting: Advanced Practice Midwife

## 2013-10-14 ENCOUNTER — Other Ambulatory Visit (HOSPITAL_COMMUNITY)
Admission: RE | Admit: 2013-10-14 | Discharge: 2013-10-14 | Disposition: A | Discharge status: Home / Self Care | Payer: Medicaid Other | Source: Ambulatory Visit | Attending: Advanced Practice Midwife | Admitting: Advanced Practice Midwife

## 2013-10-14 ENCOUNTER — Ambulatory Visit (INDEPENDENT_AMBULATORY_CARE_PROVIDER_SITE_OTHER): Payer: Medicaid Other

## 2013-10-14 VITALS — BP 114/42 | Wt 136.0 lb

## 2013-10-14 DIAGNOSIS — Z01419 Encounter for gynecological examination (general) (routine) without abnormal findings: Secondary | ICD-10-CM

## 2013-10-14 DIAGNOSIS — F1920 Other psychoactive substance dependence, uncomplicated: Secondary | ICD-10-CM

## 2013-10-14 DIAGNOSIS — O99320 Drug use complicating pregnancy, unspecified trimester: Secondary | ICD-10-CM

## 2013-10-14 DIAGNOSIS — O0930 Supervision of pregnancy with insufficient antenatal care, unspecified trimester: Secondary | ICD-10-CM

## 2013-10-14 DIAGNOSIS — O0933 Supervision of pregnancy with insufficient antenatal care, third trimester: Secondary | ICD-10-CM | POA: Insufficient documentation

## 2013-10-14 DIAGNOSIS — Z1389 Encounter for screening for other disorder: Secondary | ICD-10-CM

## 2013-10-14 DIAGNOSIS — O99019 Anemia complicating pregnancy, unspecified trimester: Secondary | ICD-10-CM

## 2013-10-14 DIAGNOSIS — Z3490 Encounter for supervision of normal pregnancy, unspecified, unspecified trimester: Secondary | ICD-10-CM | POA: Insufficient documentation

## 2013-10-14 DIAGNOSIS — O09893 Supervision of other high risk pregnancies, third trimester: Secondary | ICD-10-CM

## 2013-10-14 DIAGNOSIS — Z113 Encounter for screening for infections with a predominantly sexual mode of transmission: Secondary | ICD-10-CM | POA: Insufficient documentation

## 2013-10-14 DIAGNOSIS — Z331 Pregnant state, incidental: Secondary | ICD-10-CM

## 2013-10-14 DIAGNOSIS — O09899 Supervision of other high risk pregnancies, unspecified trimester: Secondary | ICD-10-CM

## 2013-10-14 DIAGNOSIS — Z3480 Encounter for supervision of other normal pregnancy, unspecified trimester: Secondary | ICD-10-CM

## 2013-10-14 DIAGNOSIS — F1290 Cannabis use, unspecified, uncomplicated: Secondary | ICD-10-CM

## 2013-10-14 LAB — POCT URINALYSIS DIPSTICK
Blood, UA: NEGATIVE
Glucose, UA: NEGATIVE
KETONES UA: NEGATIVE
Nitrite, UA: NEGATIVE
PROTEIN UA: NEGATIVE

## 2013-10-14 LAB — CBC
HEMATOCRIT: 30.8 % — AB (ref 36.0–46.0)
Hemoglobin: 10.5 g/dL — ABNORMAL LOW (ref 12.0–15.0)
MCH: 29.7 pg (ref 26.0–34.0)
MCHC: 34.1 g/dL (ref 30.0–36.0)
MCV: 87.3 fL (ref 78.0–100.0)
Platelets: 307 10*3/uL (ref 150–400)
RBC: 3.53 MIL/uL — ABNORMAL LOW (ref 3.87–5.11)
RDW: 14.2 % (ref 11.5–15.5)
WBC: 7.5 10*3/uL (ref 4.0–10.5)

## 2013-10-14 LAB — OB RESULTS CONSOLE HIV ANTIBODY (ROUTINE TESTING): HIV: NONREACTIVE

## 2013-10-14 LAB — OB RESULTS CONSOLE RPR: RPR: NONREACTIVE

## 2013-10-14 MED ORDER — METRONIDAZOLE 500 MG PO TABS: 500.0000 mg | ORAL_TABLET | Freq: Two times a day (BID) | ORAL | Status: DC

## 2013-10-14 NOTE — Patient Instructions (Signed)
1. Before your test, do not eat or drink anything for 8-10 hours prior to your  appointment (a small amount of water is allowed and you may take any medicines you normally take). 2. When you arrive, your blood will be drawn for a 'fasting' blood sugar level.  Then you will be given a sweetened carbonated beverage to drink. You should  complete drinking this beverage within five minutes. After finishing the  beverage, you will have your blood drawn exactly 1 and 2 hours later. Having  your blood drawn on time is an important part of this test. A total of three blood  samples will be done. 3. The test takes approximately 2  hours. During the test, do not have anything to  eat or drink. Do not smoke, chew gum (not even sugarless gum) or use breath mints.  4. During the test you should remain close by and seated as much as possible and  avoid walking around. You may want to bring a book or something else to  occupy your time.  5. After your test, you may eat and drink as normal. You may want to bring a snack  to eat after the test is finished. Your provider will advise you as to the results of  this test and any follow-up if necessary  You will also be retested for syphilis, HIV and blood levels (anemia):  You were already tested in the first trimester, but [place] recommends retesting.  Additionally, you will be tested for Type 2 Herpes. MOST people do not know that they have genital herpes, as only around 15% of people have outbreaks.  However, it is still transmittable to other people, including the baby (but only during the birth).  If you test positive for Type 2 Herpes, we place you on a medicine called acyclovir the last 6 weeks of your pregnancy to prevent transmission of the virus to the baby during the birth.    If your sugar test is positive for gestational diabetes, you will be given an phone call and further instructions discussed.  We typically do not call patients with positive  herpes results, but will discuss it at your next appointment.  If you wish to know all of your test results before your next appointment, feel free to call the office, or look up your test results on Mychart.  (The range that the lab uses for normal values of the sugar test are not necessarily the range that is used for pregnant women; if your results are within the range, they are definitely normal.  However, if a value is deemed "high" by the lab, it may not be too high for a pregnant woman.  We will need to discuss the normal range if your value(s) fall in the "high" category).     

## 2013-10-14 NOTE — Progress Notes (Addendum)
  Subjective:    [name] [name] [name] is a G2P1001 5664w6d being seen today for her first obstetrical visit.  Her obstetrical history is significant for late [name] [name] [name], short interval b/t pregnancies.  She is still breastfeeding some, but plans to wean. Pregnancy history fully reviewed. She had borderline oligo/IUGR.  Patient reports no complaints.  Filed Vitals:   10/14/13 1450  BP: 114/42  Weight: 136 lb (61.689 kg)    HISTORY: OB History  Gravida Para Term Preterm AB SAB TAB Ectopic Multiple Living  2 1 1       1     # Outcome Date GA Lbr Len/2nd Weight Sex Delivery Anes PTL Lv  2 CUR           1 TRM 10/28/12 3361w5d 07:48 / 01:36 6 lb (2.722 kg) M VAC EPI  Y     Past Medical History  Diagnosis Date  . History of chlamydia     Pt and partner was treated.   History reviewed. No pertinent past surgical history. Family History  Problem Relation Age of Onset  . Coronary artery disease Other   . Hypertension Other   . Diabetes Other   . Thyroid disease Other      Exam       Pelvic Exam:    Perineum: Normal Perineum   Vulva: normal   Vagina:  normal mucosa,no palpable nodules; thin white frothy discharge with amine odor   Uterus    28cms.       Cervix: normal   Adnexa: Not palpable   Urinary:  urethral meatus normal    System: Breast:  normal appearance, no masses or tenderness   Skin: normal coloration and turgor, no rashes    Neurologic: oriented, normal, normal mood   Extremities: normal strength, tone, and muscle mass   HEENT PERRLA   Mouth/Teeth mucous membranes moist, pharynx normal without lesions   Neck supple and no masses   Cardiovascular: regular rate and rhythm   Respiratory:  appears well, vitals normal, no respiratory distress, acyanotic, normal RR   Abdomen: soft, non-tender; bowel sounds normal; no masses,  no organomegaly          Assessment:    Pregnancy: G2P1001 Patient Active Problem List   Diagnosis Date Noted  . Insufficient prenatal care in  third trimester 10/14/2013  . Pregnant 10/14/2013  . Short interval between pregnancies complicating pregnancy in third trimester, antepartum 10/14/2013        Plan:     Initial labs drawn. Continue prenatal vitamins  Problem list reviewed and updated  Reviewed recommended weight gain based on pre-gravid BMI  Encouraged well-balanced diet Genetic Screening discussed :Too late .  Ultrasound discussed; fetal survey: results reviewed.  Follow up in ASAP for PN2 Flagyl 500mg  BID X 7  [name] [name],[name] [name] [name] 10/14/2013

## 2013-10-14 NOTE — Progress Notes (Signed)
U/S(29+6wks)-vtx active fetus, meas c/w dates, EFW 2 lb 15 oz (37th%tile), fluid wnl, FHR-153 bpm, posterior Gr 1 placenta, no obvious abnl noted although difficult to evaluate due to advanced Gestational Age, female fetus

## 2013-10-15 ENCOUNTER — Encounter: Payer: Self-pay | Admitting: Advanced Practice Midwife

## 2013-10-15 DIAGNOSIS — F1290 Cannabis use, unspecified, uncomplicated: Secondary | ICD-10-CM | POA: Insufficient documentation

## 2013-10-15 LAB — DRUG SCREEN, URINE, NO CONFIRMATION
Amphetamine Screen, Ur: NEGATIVE
BARBITURATE QUANT UR: NEGATIVE
BENZODIAZEPINES.: NEGATIVE
Cocaine Metabolites: NEGATIVE
Creatinine,U: 99.6 mg/dL
MARIJUANA METABOLITE: POSITIVE — AB
METHADONE: NEGATIVE
Opiate Screen, Urine: NEGATIVE
PROPOXYPHENE: NEGATIVE
Phencyclidine (PCP): NEGATIVE

## 2013-10-15 LAB — HEPATITIS B SURFACE ANTIGEN: HEP B S AG: NEGATIVE

## 2013-10-15 LAB — VARICELLA ZOSTER ANTIBODY, IGG: Varicella IgG: 739.2 Index — ABNORMAL HIGH (ref <135.00)

## 2013-10-15 LAB — OXYCODONE SCREEN, UA, RFLX CONFIRM: Oxycodone Screen, Ur: NEGATIVE ng/mL

## 2013-10-15 LAB — RPR

## 2013-10-15 LAB — ANTIBODY SCREEN: Antibody Screen: NEGATIVE

## 2013-10-15 LAB — RUBELLA SCREEN: RUBELLA: 1.05 {index} — AB (ref <0.90)

## 2013-10-15 LAB — HIV ANTIBODY (ROUTINE TESTING W REFLEX): HIV: NONREACTIVE

## 2013-10-16 ENCOUNTER — Other Ambulatory Visit: Payer: Self-pay

## 2013-10-16 LAB — URINE CULTURE: Colony Count: 30000

## 2013-10-20 ENCOUNTER — Other Ambulatory Visit: Payer: Self-pay

## 2013-10-21 ENCOUNTER — Encounter: Payer: Self-pay | Admitting: Women's Health

## 2013-10-21 ENCOUNTER — Other Ambulatory Visit: Payer: Self-pay

## 2013-11-17 ENCOUNTER — Ambulatory Visit (INDEPENDENT_AMBULATORY_CARE_PROVIDER_SITE_OTHER): Payer: Medicaid Other | Admitting: Obstetrics and Gynecology

## 2013-11-17 ENCOUNTER — Telehealth: Payer: Self-pay | Admitting: *Deleted

## 2013-11-17 ENCOUNTER — Encounter: Payer: Self-pay | Admitting: Obstetrics and Gynecology

## 2013-11-17 VITALS — BP 98/58 | Wt 141.6 lb

## 2013-11-17 DIAGNOSIS — Z331 Pregnant state, incidental: Secondary | ICD-10-CM

## 2013-11-17 DIAGNOSIS — O9989 Other specified diseases and conditions complicating pregnancy, childbirth and the puerperium: Secondary | ICD-10-CM

## 2013-11-17 DIAGNOSIS — O4700 False labor before 37 completed weeks of gestation, unspecified trimester: Secondary | ICD-10-CM

## 2013-11-17 DIAGNOSIS — O0930 Supervision of pregnancy with insufficient antenatal care, unspecified trimester: Secondary | ICD-10-CM

## 2013-11-17 DIAGNOSIS — O479 False labor, unspecified: Secondary | ICD-10-CM

## 2013-11-17 DIAGNOSIS — Z1389 Encounter for screening for other disorder: Secondary | ICD-10-CM

## 2013-11-17 DIAGNOSIS — O99019 Anemia complicating pregnancy, unspecified trimester: Secondary | ICD-10-CM

## 2013-11-17 DIAGNOSIS — O09899 Supervision of other high risk pregnancies, unspecified trimester: Secondary | ICD-10-CM

## 2013-11-17 DIAGNOSIS — O99320 Drug use complicating pregnancy, unspecified trimester: Secondary | ICD-10-CM

## 2013-11-17 DIAGNOSIS — Z3490 Encounter for supervision of normal pregnancy, unspecified, unspecified trimester: Secondary | ICD-10-CM

## 2013-11-17 DIAGNOSIS — F1920 Other psychoactive substance dependence, uncomplicated: Secondary | ICD-10-CM

## 2013-11-17 LAB — POCT URINALYSIS DIPSTICK
Blood, UA: NEGATIVE
Glucose, UA: NEGATIVE
Ketones, UA: NEGATIVE
NITRITE UA: NEGATIVE
Protein, UA: NEGATIVE

## 2013-11-17 NOTE — Progress Notes (Signed)
2058w5d 812P1001 female presents for prenatal visit today. Patient states she has been having contractions about 3-5 minutes apart. She denies vaginal bleeding, vaginal discharge, or fluid leakage. Monitored, contractions q 5-10 min, mild, FHR cat I, reactive NST.  Pelvic exam: Cervix: long, closed, posterior

## 2013-11-17 NOTE — Telephone Encounter (Signed)
Pt c/o irregular contractions. Pt to be seen today at 2:30 pm, could not be seen before due to work schedule today. Pt states has missed several OB appt due to work and transportation issues. Pt only been seen 3 times for her prenatal care this pregnancy. Pt encouraged to keep this appt, pt has not had Glucose tolerance test this pregnancy.

## 2013-11-18 ENCOUNTER — Other Ambulatory Visit: Payer: Self-pay | Admitting: Obstetrics and Gynecology

## 2013-11-18 DIAGNOSIS — O26849 Uterine size-date discrepancy, unspecified trimester: Secondary | ICD-10-CM

## 2013-11-19 ENCOUNTER — Encounter: Payer: Self-pay | Admitting: Obstetrics and Gynecology

## 2013-11-19 ENCOUNTER — Ambulatory Visit (INDEPENDENT_AMBULATORY_CARE_PROVIDER_SITE_OTHER): Payer: Medicaid Other

## 2013-11-19 ENCOUNTER — Other Ambulatory Visit: Payer: Self-pay | Admitting: Obstetrics and Gynecology

## 2013-11-19 DIAGNOSIS — O26849 Uterine size-date discrepancy, unspecified trimester: Secondary | ICD-10-CM

## 2013-11-19 DIAGNOSIS — O365990 Maternal care for other known or suspected poor fetal growth, unspecified trimester, not applicable or unspecified: Secondary | ICD-10-CM

## 2013-11-19 NOTE — Progress Notes (Signed)
U/S(35+0wks)-vtx active fetus, EFw 4 lb 3 oz (<3rd%tile), fluid wnl AFI-8.4cm SDP-4.5cm, posterior Gr 2 placenta, UA Doppler RI-0.62 & 0.65 S/D-2.62 & 2.82, BPP 8/8, female fetus, FHR-142 bpm

## 2013-11-24 ENCOUNTER — Other Ambulatory Visit: Payer: Self-pay | Admitting: Obstetrics and Gynecology

## 2013-11-24 ENCOUNTER — Telehealth: Payer: Self-pay | Admitting: Obstetrics and Gynecology

## 2013-11-24 NOTE — Telephone Encounter (Signed)
Number for [name] Wood is currently invalid.

## 2013-11-25 ENCOUNTER — Other Ambulatory Visit: Payer: Self-pay | Admitting: Obstetrics and Gynecology

## 2013-11-25 ENCOUNTER — Ambulatory Visit (INDEPENDENT_AMBULATORY_CARE_PROVIDER_SITE_OTHER): Payer: Medicaid Other | Admitting: Obstetrics & Gynecology

## 2013-11-25 ENCOUNTER — Telehealth: Payer: Self-pay | Admitting: Obstetrics and Gynecology

## 2013-11-25 ENCOUNTER — Encounter: Payer: Self-pay | Admitting: Obstetrics & Gynecology

## 2013-11-25 VITALS — BP 100/70 | Wt 144.0 lb

## 2013-11-25 DIAGNOSIS — O99320 Drug use complicating pregnancy, unspecified trimester: Secondary | ICD-10-CM

## 2013-11-25 DIAGNOSIS — O365990 Maternal care for other known or suspected poor fetal growth, unspecified trimester, not applicable or unspecified: Secondary | ICD-10-CM

## 2013-11-25 DIAGNOSIS — IMO0002 Reserved for concepts with insufficient information to code with codable children: Secondary | ICD-10-CM | POA: Insufficient documentation

## 2013-11-25 DIAGNOSIS — O0930 Supervision of pregnancy with insufficient antenatal care, unspecified trimester: Secondary | ICD-10-CM

## 2013-11-25 DIAGNOSIS — O09899 Supervision of other high risk pregnancies, unspecified trimester: Secondary | ICD-10-CM

## 2013-11-25 DIAGNOSIS — F1920 Other psychoactive substance dependence, uncomplicated: Secondary | ICD-10-CM

## 2013-11-25 DIAGNOSIS — Z1389 Encounter for screening for other disorder: Secondary | ICD-10-CM

## 2013-11-25 DIAGNOSIS — Z331 Pregnant state, incidental: Secondary | ICD-10-CM

## 2013-11-25 DIAGNOSIS — O99019 Anemia complicating pregnancy, unspecified trimester: Secondary | ICD-10-CM

## 2013-11-25 LAB — POCT URINALYSIS DIPSTICK
Blood, UA: NEGATIVE
GLUCOSE UA: NEGATIVE
Ketones, UA: NEGATIVE
Nitrite, UA: NEGATIVE

## 2013-11-25 NOTE — Progress Notes (Signed)
Reactive NST(IUGR)  Continue twice weekly evals sonogram alternating with NST  BP weight and urine results all reviewed and noted. Patient reports good fetal movement, denies any bleeding and no rupture of membranes symptoms or regular contractions. Patient is without complaints. All questions were answered.

## 2013-11-25 NOTE — Telephone Encounter (Signed)
Called pt this am at 7:30, left message for pt to call, also called at 9 am, no answer.

## 2013-11-26 ENCOUNTER — Encounter: Payer: Self-pay | Admitting: Obstetrics and Gynecology

## 2013-11-27 ENCOUNTER — Ambulatory Visit (INDEPENDENT_AMBULATORY_CARE_PROVIDER_SITE_OTHER): Payer: Medicaid Other

## 2013-11-27 ENCOUNTER — Ambulatory Visit (INDEPENDENT_AMBULATORY_CARE_PROVIDER_SITE_OTHER): Payer: Medicaid Other | Admitting: Advanced Practice Midwife

## 2013-11-27 ENCOUNTER — Other Ambulatory Visit: Payer: Self-pay | Admitting: Obstetrics and Gynecology

## 2013-11-27 VITALS — BP 100/64 | Wt 146.0 lb

## 2013-11-27 DIAGNOSIS — O365990 Maternal care for other known or suspected poor fetal growth, unspecified trimester, not applicable or unspecified: Secondary | ICD-10-CM

## 2013-11-27 DIAGNOSIS — O3660X0 Maternal care for excessive fetal growth, unspecified trimester, not applicable or unspecified: Secondary | ICD-10-CM

## 2013-11-27 DIAGNOSIS — IMO0002 Reserved for concepts with insufficient information to code with codable children: Secondary | ICD-10-CM

## 2013-11-27 DIAGNOSIS — Z331 Pregnant state, incidental: Secondary | ICD-10-CM

## 2013-11-27 DIAGNOSIS — Z1389 Encounter for screening for other disorder: Secondary | ICD-10-CM

## 2013-11-27 LAB — POCT URINALYSIS DIPSTICK
Blood, UA: 1
Glucose, UA: NEGATIVE
KETONES UA: NEGATIVE
Nitrite, UA: NEGATIVE

## 2013-11-27 NOTE — Progress Notes (Signed)
Had u/s today for IUGR.  Normal fluid/dopplers.  Does not want IOL if possible (explained about 39 weeks and is OK with that). Recommended OTC iron. Wants NExplanon at 3 weeks.  Paper signed.   No c/o at this time.  Routine questions about pregnancy answered.  F/U in Monday  for NST.

## 2013-11-27 NOTE — Progress Notes (Signed)
U/S(36+1wks)-vtx active fetus, FHR- 153 bpm, BPP 8/8, fluid wnl AFI-9.1cm SDP-3.1cm, posterior Gr 1 placenta, UA Doppler RI-0.62 & 0.59 S/D 2.62 & 2.45

## 2013-12-01 ENCOUNTER — Other Ambulatory Visit: Payer: Self-pay | Admitting: Obstetrics & Gynecology

## 2013-12-01 ENCOUNTER — Encounter: Payer: Self-pay | Admitting: Women's Health

## 2013-12-03 ENCOUNTER — Other Ambulatory Visit: Payer: Self-pay | Admitting: Obstetrics and Gynecology

## 2013-12-03 DIAGNOSIS — O365990 Maternal care for other known or suspected poor fetal growth, unspecified trimester, not applicable or unspecified: Secondary | ICD-10-CM

## 2013-12-04 ENCOUNTER — Encounter: Payer: Self-pay | Admitting: Obstetrics & Gynecology

## 2013-12-04 ENCOUNTER — Ambulatory Visit (INDEPENDENT_AMBULATORY_CARE_PROVIDER_SITE_OTHER): Payer: Medicaid Other

## 2013-12-04 ENCOUNTER — Ambulatory Visit (INDEPENDENT_AMBULATORY_CARE_PROVIDER_SITE_OTHER): Payer: Medicaid Other | Admitting: Obstetrics & Gynecology

## 2013-12-04 VITALS — BP 98/60 | Wt 144.0 lb

## 2013-12-04 DIAGNOSIS — Z1389 Encounter for screening for other disorder: Secondary | ICD-10-CM

## 2013-12-04 DIAGNOSIS — O365990 Maternal care for other known or suspected poor fetal growth, unspecified trimester, not applicable or unspecified: Secondary | ICD-10-CM

## 2013-12-04 DIAGNOSIS — IMO0002 Reserved for concepts with insufficient information to code with codable children: Secondary | ICD-10-CM

## 2013-12-04 DIAGNOSIS — Z331 Pregnant state, incidental: Secondary | ICD-10-CM

## 2013-12-04 LAB — POCT URINALYSIS DIPSTICK
Blood, UA: NEGATIVE
Glucose, UA: NEGATIVE
KETONES UA: NEGATIVE
Nitrite, UA: NEGATIVE

## 2013-12-04 NOTE — Progress Notes (Signed)
Sonogram is reviewed and report done, EFW 4% with all other parameters normal  G2P1001 6853w1d Estimated Date of Delivery: 12/24/13   BP weight and urine results all reviewed and noted. Patient reports good fetal movement, denies any bleeding and no rupture of membranes symptoms or regular contractions.  Patient is without complaints. All questions were answered.  Follow up in 4 days for NST + ob visit

## 2013-12-04 NOTE — Progress Notes (Signed)
U/S(37+1wks)-vtx active fetus, EFW 5 lb 3 oz (4th%tile), BPP 8/8, fluid wnl AFI-9.3cm & SDP-2.8cm, UA Doppler RI-0.68 & 0.72, FHR-143bpm, posterior Gr 2 placenta

## 2013-12-07 ENCOUNTER — Inpatient Hospital Stay (HOSPITAL_COMMUNITY): Payer: Medicaid Other | Admitting: Anesthesiology

## 2013-12-07 ENCOUNTER — Encounter (HOSPITAL_COMMUNITY): Payer: Medicaid Other | Admitting: Anesthesiology

## 2013-12-07 ENCOUNTER — Inpatient Hospital Stay (HOSPITAL_COMMUNITY)
Admission: AD | Admit: 2013-12-07 | Discharge: 2013-12-09 | DRG: 775 | Disposition: A | Discharge status: Home / Self Care | Payer: Medicaid Other | Source: Ambulatory Visit | Attending: Obstetrics & Gynecology | Admitting: Obstetrics & Gynecology

## 2013-12-07 ENCOUNTER — Encounter (HOSPITAL_COMMUNITY): Payer: Self-pay | Admitting: *Deleted

## 2013-12-07 DIAGNOSIS — IMO0002 Reserved for concepts with insufficient information to code with codable children: Secondary | ICD-10-CM

## 2013-12-07 DIAGNOSIS — O365990 Maternal care for other known or suspected poor fetal growth, unspecified trimester, not applicable or unspecified: Principal | ICD-10-CM | POA: Diagnosis present

## 2013-12-07 DIAGNOSIS — O99344 Other mental disorders complicating childbirth: Secondary | ICD-10-CM | POA: Diagnosis present

## 2013-12-07 DIAGNOSIS — O4290 Premature rupture of membranes, unspecified as to length of time between rupture and onset of labor, unspecified weeks of gestation: Secondary | ICD-10-CM | POA: Diagnosis present

## 2013-12-07 DIAGNOSIS — D649 Anemia, unspecified: Secondary | ICD-10-CM | POA: Diagnosis present

## 2013-12-07 DIAGNOSIS — Z833 Family history of diabetes mellitus: Secondary | ICD-10-CM

## 2013-12-07 DIAGNOSIS — Z8249 Family history of ischemic heart disease and other diseases of the circulatory system: Secondary | ICD-10-CM

## 2013-12-07 DIAGNOSIS — F1210 Cannabis abuse, uncomplicated: Secondary | ICD-10-CM | POA: Diagnosis present

## 2013-12-07 DIAGNOSIS — O9902 Anemia complicating childbirth: Secondary | ICD-10-CM | POA: Diagnosis present

## 2013-12-07 DIAGNOSIS — O0930 Supervision of pregnancy with insufficient antenatal care, unspecified trimester: Secondary | ICD-10-CM

## 2013-12-07 LAB — CBC
HCT: 31 % — ABNORMAL LOW (ref 36.0–46.0)
HEMOGLOBIN: 10.5 g/dL — AB (ref 12.0–15.0)
MCH: 29.3 pg (ref 26.0–34.0)
MCHC: 33.9 g/dL (ref 30.0–36.0)
MCV: 86.6 fL (ref 78.0–100.0)
Platelets: 327 10*3/uL (ref 150–400)
RBC: 3.58 MIL/uL — ABNORMAL LOW (ref 3.87–5.11)
RDW: 14.1 % (ref 11.5–15.5)
WBC: 8.3 10*3/uL (ref 4.0–10.5)

## 2013-12-07 LAB — GROUP B STREP BY PCR: GROUP B STREP BY PCR: NEGATIVE

## 2013-12-07 LAB — RPR

## 2013-12-07 LAB — POCT FERN TEST

## 2013-12-07 LAB — OB RESULTS CONSOLE GBS: GBS: NEGATIVE

## 2013-12-07 MED ORDER — TETANUS-DIPHTH-ACELL PERTUSSIS 5-2.5-18.5 LF-MCG/0.5 IM SUSP: 0.5000 mL | Freq: Once | INTRAMUSCULAR | Status: DC

## 2013-12-07 MED ORDER — WITCH HAZEL-GLYCERIN EX PADS: 1.0000 "application " | MEDICATED_PAD | CUTANEOUS | Status: DC | PRN

## 2013-12-07 MED ORDER — LIDOCAINE HCL (PF) 1 % IJ SOLN
INTRAMUSCULAR | Status: DC | PRN
  Administered 2013-12-07 (×2): 4 mL

## 2013-12-07 MED ORDER — EPHEDRINE 5 MG/ML INJ
10.0000 mg | INTRAVENOUS | Status: DC | PRN
  Filled 2013-12-07: qty 2

## 2013-12-07 MED ORDER — ONDANSETRON HCL 4 MG PO TABS: 4.0000 mg | ORAL_TABLET | ORAL | Status: DC | PRN

## 2013-12-07 MED ORDER — LACTATED RINGERS IV SOLN: 500.0000 mL | Freq: Once | INTRAVENOUS | Status: DC

## 2013-12-07 MED ORDER — LANOLIN HYDROUS EX OINT: TOPICAL_OINTMENT | CUTANEOUS | Status: DC | PRN

## 2013-12-07 MED ORDER — OXYCODONE-ACETAMINOPHEN 5-325 MG PO TABS: 1.0000 | ORAL_TABLET | ORAL | Status: DC | PRN

## 2013-12-07 MED ORDER — LACTATED RINGERS IV SOLN
INTRAVENOUS | Status: DC
  Administered 2013-12-07 (×2): via INTRAVENOUS

## 2013-12-07 MED ORDER — SENNOSIDES-DOCUSATE SODIUM 8.6-50 MG PO TABS
2.0000 | ORAL_TABLET | ORAL | Status: DC
  Administered 2013-12-08 (×2): 2 via ORAL
  Filled 2013-12-07 (×2): qty 2

## 2013-12-07 MED ORDER — LACTATED RINGERS IV SOLN
500.0000 mL | INTRAVENOUS | Status: DC | PRN
  Administered 2013-12-07: 500 mL via INTRAVENOUS

## 2013-12-07 MED ORDER — DIPHENHYDRAMINE HCL 25 MG PO CAPS: 25.0000 mg | ORAL_CAPSULE | Freq: Four times a day (QID) | ORAL | Status: DC | PRN

## 2013-12-07 MED ORDER — OXYTOCIN 40 UNITS IN LACTATED RINGERS INFUSION - SIMPLE MED: 62.5000 mL/h | INTRAVENOUS | Status: DC

## 2013-12-07 MED ORDER — ONDANSETRON HCL 4 MG/2ML IJ SOLN: 4.0000 mg | Freq: Four times a day (QID) | INTRAMUSCULAR | Status: DC | PRN

## 2013-12-07 MED ORDER — DIPHENHYDRAMINE HCL 50 MG/ML IJ SOLN: 12.5000 mg | INTRAMUSCULAR | Status: DC | PRN

## 2013-12-07 MED ORDER — CITRIC ACID-SODIUM CITRATE 334-500 MG/5ML PO SOLN
30.0000 mL | ORAL | Status: DC | PRN
Start: 2013-12-07 — End: 2013-12-08

## 2013-12-07 MED ORDER — OXYTOCIN BOLUS FROM INFUSION
500.0000 mL | INTRAVENOUS | Status: DC
  Administered 2013-12-07: 500 mL via INTRAVENOUS

## 2013-12-07 MED ORDER — EPHEDRINE 5 MG/ML INJ
10.0000 mg | INTRAVENOUS | Status: DC | PRN
  Filled 2013-12-07: qty 2
  Filled 2013-12-07: qty 4

## 2013-12-07 MED ORDER — OXYTOCIN 40 UNITS IN LACTATED RINGERS INFUSION - SIMPLE MED
1.0000 m[IU]/min | INTRAVENOUS | Status: DC
  Administered 2013-12-07: 2 m[IU]/min via INTRAVENOUS
  Filled 2013-12-07: qty 1000

## 2013-12-07 MED ORDER — LIDOCAINE HCL (PF) 1 % IJ SOLN
30.0000 mL | INTRAMUSCULAR | Status: DC | PRN
  Filled 2013-12-07: qty 30

## 2013-12-07 MED ORDER — DIBUCAINE 1 % RE OINT: 1.0000 "application " | TOPICAL_OINTMENT | RECTAL | Status: DC | PRN

## 2013-12-07 MED ORDER — BENZOCAINE-MENTHOL 20-0.5 % EX AERO: 1.0000 "application " | INHALATION_SPRAY | CUTANEOUS | Status: DC | PRN

## 2013-12-07 MED ORDER — ZOLPIDEM TARTRATE 5 MG PO TABS: 5.0000 mg | ORAL_TABLET | Freq: Every evening | ORAL | Status: DC | PRN

## 2013-12-07 MED ORDER — SIMETHICONE 80 MG PO CHEW: 80.0000 mg | CHEWABLE_TABLET | ORAL | Status: DC | PRN

## 2013-12-07 MED ORDER — PHENYLEPHRINE 40 MCG/ML (10ML) SYRINGE FOR IV PUSH (FOR BLOOD PRESSURE SUPPORT)
80.0000 ug | PREFILLED_SYRINGE | INTRAVENOUS | Status: DC | PRN
  Filled 2013-12-07: qty 10
  Filled 2013-12-07: qty 2

## 2013-12-07 MED ORDER — PRENATAL MULTIVITAMIN CH
1.0000 | ORAL_TABLET | Freq: Every day | ORAL | Status: DC
  Administered 2013-12-08: 1 via ORAL
  Filled 2013-12-07: qty 1

## 2013-12-07 MED ORDER — FENTANYL 2.5 MCG/ML BUPIVACAINE 1/10 % EPIDURAL INFUSION (WH - ANES)
14.0000 mL/h | INTRAMUSCULAR | Status: DC | PRN
Start: 2013-12-07 — End: 2013-12-08
  Filled 2013-12-07: qty 125

## 2013-12-07 MED ORDER — ACETAMINOPHEN 325 MG PO TABS: 650.0000 mg | ORAL_TABLET | ORAL | Status: DC | PRN

## 2013-12-07 MED ORDER — TERBUTALINE SULFATE 1 MG/ML IJ SOLN: 0.2500 mg | Freq: Once | INTRAMUSCULAR | Status: AC | PRN

## 2013-12-07 MED ORDER — IBUPROFEN 600 MG PO TABS
600.0000 mg | ORAL_TABLET | Freq: Four times a day (QID) | ORAL | Status: DC
  Administered 2013-12-08 – 2013-12-09 (×6): 600 mg via ORAL
  Filled 2013-12-07 (×6): qty 1

## 2013-12-07 MED ORDER — IBUPROFEN 600 MG PO TABS: 600.0000 mg | ORAL_TABLET | Freq: Four times a day (QID) | ORAL | Status: DC | PRN

## 2013-12-07 MED ORDER — PHENYLEPHRINE 40 MCG/ML (10ML) SYRINGE FOR IV PUSH (FOR BLOOD PRESSURE SUPPORT)
80.0000 ug | PREFILLED_SYRINGE | INTRAVENOUS | Status: DC | PRN
  Filled 2013-12-07: qty 2

## 2013-12-07 MED ORDER — ONDANSETRON HCL 4 MG/2ML IJ SOLN: 4.0000 mg | INTRAMUSCULAR | Status: DC | PRN

## 2013-12-07 MED ORDER — FENTANYL 2.5 MCG/ML BUPIVACAINE 1/10 % EPIDURAL INFUSION (WH - ANES)
INTRAMUSCULAR | Status: DC | PRN
  Administered 2013-12-07: 14 mL/h via EPIDURAL

## 2013-12-07 MED ORDER — FENTANYL CITRATE 0.05 MG/ML IJ SOLN: 100.0000 ug | INTRAMUSCULAR | Status: DC | PRN

## 2013-12-07 NOTE — Progress Notes (Signed)
Spoke with Dorathy Kinsman cnm - aware that pt having variables at time - still with decreased variablilty - requested f/c but v smith cnm will recheck at 1930 and evaluate at that time

## 2013-12-07 NOTE — H&P (Signed)
HPI: [name] Wood is a 23 y.o. year old G8P1001 female at [redacted]w[redacted]d weeks gestation by 13 week limited US (no LMP due to lactational amenorrhea) who presents to [facility]U reporting leaking amniotic fluid since 0730 this morning and mild contractions.   [facility]  FOB Cyndie Chime  Pap Normal  GC/CT Initial:                36+wks:N/N  Genetic Screen NT/IT: Too late  CF screen negative  Anatomic [place] 27.5 weeks IUGR  Flu vaccine   Tdap Recommended ~ 28wks  Glucose Screen  2 hr: not done  GBS Neg  Feed Preference breast  Contraception   Circumcision   Childbirth Classes NA  Pediatrician      Patient Active Problem List   Diagnosis Date Noted  . IUGR (intrauterine growth restriction) 11/25/2013  . Marijuana smoker 10/15/2013  . Insufficient prenatal care in third trimester 10/14/2013  . Pregnant 10/14/2013  . Short interval between pregnancies complicating pregnancy in third trimester, antepartum 10/14/2013    Maternal Medical History:  Reason for admission: Nausea.    OB History   Grav Para Term Preterm Abortions TAB SAB Ect Mult Living   2 1 1       1      Past Medical History  Diagnosis Date  . History of chlamydia     Pt and partner was treated.   History reviewed. No pertinent past surgical history. Family History: family history includes Coronary artery disease in her other; Diabetes in her other; Hypertension in her other; Thyroid disease in her other. Social History:  reports that she has never smoked. She has never used smokeless tobacco. She reports that she does not drink alcohol or use illicit drugs.   Prenatal Transfer Tool  Maternal Diabetes: No--Missed test Genetic Screening: Declined-Too late Maternal Ultrasounds/Referrals: Abnormal:  Findings:   IUGR Fetal Ultrasounds or other Referrals:  None Maternal Substance Abuse:  Yes:  Type: Marijuana Significant Maternal Medications:  None Significant Maternal Lab Results:  Lab values include: Group B  Strep negative Other Comments:  Late to care. Dated by 27 week [place]. Closed pregnancy spacing.  Review of Systems  Constitutional: Negative for fever and chills.  Eyes: Negative for blurred vision.  Gastrointestinal: Positive for abdominal pain (cramping). Negative for nausea and vomiting.  Neurological: Negative for headaches.    Dilation: 3 Effacement (%): 40 Station: -2 Exam by:: Sherree Shankman cnm Blood pressure 93/52, pulse 91, temperature 97.8 F (36.6 C), temperature source Oral, resp. rate 18, height 5\' 5"  (1.651 m), weight 65.318 kg (144 lb), unknown if currently breastfeeding. Maternal Exam:  Uterine Assessment: Contraction strength is mild.  Contraction frequency is irregular.   Abdomen: Patient reports no abdominal tenderness. Estimated fetal weight is 6 lb.   Fetal presentation: vertex  Introitus: Normal vulva. Amniotic fluid character: clear. Grossly ruptured  Pelvis: adequate for delivery.   Cervix: Cervix evaluated by digital exam.     Physical Exam  Prenatal labs: ABO, Rh:  O pos Antibody: NEG (03/31 1537) Rubella: 1.05 (03/31 1537) RPR: NON REAC (03/31 1537)  HBsAg: NEGATIVE (03/31 1537)  HIV: NON REACTIVE (03/31 1537)  GBS: Negative (05/24 0000)   Assessment: 1. Labor: Early 2. Fetal Wellbeing: Category I  3. Pain Control: None 4. GBS: Neg 5. 37.4 week IUP dated by 27 week [place]. 6. IUGR 7. SROM x 8 hours   Plan:  1. Admit to BS per consult with MD 2. Routine L&D orders 3.  Analgesia/anesthesia PRN  4. Expectant management for now. Pit PRN.   Dorathy KinsmanVirginia Kynsie Falkner 12/07/2013, 3:24 PM

## 2013-12-07 NOTE — Progress Notes (Signed)
Jolana A Sedita is a 23 y.o. G2P1001 at [redacted]w[redacted]d.  Subjective: Comfortable w/ epidural  Objective: BP 110/65  Pulse 68  Temp(Src) 98 F (36.7 C) (Axillary)  Resp 18  Ht 5\' 5"  (1.651 m)  Wt 65.318 kg (144 lb)  BMI 23.96 kg/m2  SpO2 99%      FHT:  FHR: 115 bpm, variability: minimal ,  accelerations:  Present,  decelerations:  Present few mild variables UC:   irregular, every 3-5 minutes, moderate SVE:   Dilation: 6.5 Effacement (%): 80 Station: -2 Exam by:: V.Sabria Florido, CNM  Labs: Lab Results  Component Value Date   WBC 8.3 12/07/2013   HGB 10.5* 12/07/2013   HCT 31.0* 12/07/2013   MCV 86.6 12/07/2013   PLT 327 12/07/2013    Assessment / Plan: Protracted active phase  Labor: Progressing slowly. Preeclampsia:  NA Fetal Wellbeing:  Category II Pain Control:  Epidural I/D:  n/a Anticipated MOD:  NSVD Low dose pitocin  Dorathy Kinsman 12/07/2013, 7:49 PM

## 2013-12-07 NOTE — H&P (Signed)
Attestation of Attending Supervision of Advanced Practitioner (CNM/NP): Evaluation and management procedures were performed by the Advanced Practitioner under my supervision and collaboration.  I have reviewed the Advanced Practitioner's note and chart, and I agree with the management and plan.  [name] Wood 5:56 PM

## 2013-12-07 NOTE — Anesthesia Procedure Notes (Signed)
Epidural Patient location during procedure: OB Start time: 12/07/2013 5:54 PM  Staffing Anesthesiologist: Victorine Mcnee A.  Preanesthetic Checklist Completed: patient identified, site marked, surgical consent, pre-op evaluation, timeout performed, IV checked, risks and benefits discussed and monitors and equipment checked  Epidural Patient position: sitting Prep: site prepped and draped and DuraPrep Patient monitoring: continuous pulse ox and blood pressure Approach: midline Location: L3-L4 Injection technique: LOR air  Needle:  Needle type: Tuohy  Needle gauge: 17 G Needle length: 9 cm and 9 Needle insertion depth: 5 cm cm Catheter type: closed end flexible Catheter size: 19 Gauge Catheter at skin depth: 10 cm Test dose: negative and Other  Assessment Events: blood not aspirated, injection not painful, no injection resistance, negative IV test and no paresthesia  Additional Notes Patient identified. Risks and benefits discussed including failed block, incomplete  Pain control, post dural puncture headache, nerve damage, paralysis, blood pressure Changes, nausea, vomiting, reactions to medications-both toxic and allergic and post Partum back pain. All questions were answered. Patient expressed understanding and wished to proceed. Sterile technique was used throughout procedure. Epidural site was Dressed with sterile barrier dressing. No paresthesias, signs of intravascular injection Or signs of intrathecal spread were encountered.  Patient was more comfortable after the epidural was dosed. Please see RN's note for documentation of vital signs and FHR which are stable.

## 2013-12-07 NOTE — Anesthesia Preprocedure Evaluation (Addendum)
Anesthesia Evaluation  Patient identified by MRN, date of birth, ID band Patient awake    Reviewed: Allergy & Precautions, H&P , Patient's Chart, lab work & pertinent test results  Airway Mallampati: II TM Distance: >3 FB Neck ROM: Full    Dental  (+) Teeth Intact   Pulmonary neg pulmonary ROS,  breath sounds clear to auscultation  Pulmonary exam normal       Cardiovascular negative cardio ROS  Rhythm:Regular Rate:Normal     Neuro/Psych negative neurological ROS  negative psych ROS   GI/Hepatic negative GI ROS, Neg liver ROS,   Endo/Other  negative endocrine ROS  Renal/GU negative Renal ROS  negative genitourinary   Musculoskeletal negative musculoskeletal ROS (+)   Abdominal   Peds  Hematology  (+) anemia ,   Anesthesia Other Findings   Reproductive/Obstetrics (+) Pregnancy                          Anesthesia Physical Anesthesia Plan  ASA: II  Anesthesia Plan: Epidural   Post-op Pain Management:    Induction:   Airway Management Planned: Natural Airway  Additional Equipment:   Intra-op Plan:   Post-operative Plan:   Informed Consent: I have reviewed the patients History and Physical, chart, labs and discussed the procedure including the risks, benefits and alternatives for the proposed anesthesia with the patient or authorized representative who has indicated his/her understanding and acceptance.     Plan Discussed with: Anesthesiologist  Anesthesia Plan Comments:         Anesthesia Quick Evaluation

## 2013-12-07 NOTE — Progress Notes (Signed)
Marygrace A Lex is a 23 y.o. G2P1001 at [redacted]w[redacted]d.  Subjective: Comfortable w/ epidural  Objective: BP 102/64  Pulse 78  Temp(Src) 98 F (36.7 C) (Axillary)  Resp 18  Ht 5\' 5"  (1.651 m)  Wt 65.318 kg (144 lb)  BMI 23.96 kg/m2  SpO2 99%      FHT:  FHR: 120 bpm, variability: moderate,  accelerations:  Present,  decelerations:  Present occassional variables UC:   irregular, every 4-5 minutes, moderate SVE:   Dilation: 4.5 Effacement (%): 80 Station: -2 Exam by:: patti moore rn  Labs: Lab Results  Component Value Date   WBC 8.3 12/07/2013   HGB 10.5* 12/07/2013   HCT 31.0* 12/07/2013   MCV 86.6 12/07/2013   PLT 327 12/07/2013    Assessment / Plan: Spontaneous labor, progressing normally  Labor: Progressing normally Preeclampsia:  NA Fetal Wellbeing:  Category I and Category II Pain Control:  Epidural I/D:  n/a Anticipated MOD:  NSVD Consider pitocin if protracted labor  [place] 12/07/2013, 6:37 PM

## 2013-12-08 ENCOUNTER — Encounter (HOSPITAL_COMMUNITY): Payer: Self-pay | Admitting: *Deleted

## 2013-12-08 NOTE — Anesthesia Postprocedure Evaluation (Signed)
Anesthesia Post Note  Patient: [name] Wood  Procedure(s) Performed: * No procedures listed *  Anesthesia type: Epidural  Patient location: Mother/Baby  Post pain: Pain level controlled  Post assessment: Post-op Vital signs reviewed  Last Vitals:  Filed Vitals:   12/08/13 0410  BP: 95/59  Pulse: 78  Temp: 36.9 C  Resp: 16    Post vital signs: Reviewed  Level of consciousness:alert  Complications: No apparent anesthesia complications

## 2013-12-08 NOTE — Clinical Social Work Maternal (Signed)
Clinical Social Work Department PSYCHOSOCIAL ASSESSMENT - MATERNAL/CHILD 12/08/2013  Patient:  [name] Wood, [name] Wood  Account Number:  [id number]  Admit Date:  12/07/2013  Ardine Eng Name:   Horton Finer    Clinical Social Worker:  Gerri Spore, LCSW   Date/Time:  12/08/2013 03:58 PM  Date Referred:  12/08/2013   Referral source  CN     Referred reason  Substance Abuse  [facility]   Other referral source:    I:  FAMILY / HOME ENVIRONMENT Child's legal guardian:  PARENT  Guardian - Name Guardian - Age Guardian - Address  [name] Wood 22 [address] [place],  [address]  Tina Griffiths 23    Other household support members/support persons Name Relationship DOB  Chryl Heck GRAND MOTHER    BROTHER 22 years   UNCLE    Other support:    II  PSYCHOSOCIAL DATA Information Source:  Patient Interview  Occupational hygienist Employment:   Museum/gallery curator resources:  [employer] Bee:  [employer] Other  Air cabin crew / Grade:   Maternity Care Coordinator / Child Services Coordination / Early Interventions:  Cultural issues impacting care:    III  STRENGTHS Strengths  Adequate Resources  Home prepared for Child (including basic supplies)  Supportive family/friends   Strength comment:    IV  RISK FACTORS AND CURRENT PROBLEMS Current Problem:  YES   Risk Factor & Current Problem Patient Issue Family Issue Risk Factor / Current Problem Comment  Substance Abuse Y N Hx MJ use  Other - See comment Y N LPNC @ 29 weeks    V  SOCIAL WORK ASSESSMENT CSW met with pt to assess reason for [facility] & MJ use.  Pt told CSW that she did not receive pregnancy confirmation until 2nd trimester.  Pt sought medical attention after she started to feel movement.  Pt applied for Medicaid but had to wait for benefits approval before she could establish care.  She denies any illegal substance use during the pregnancy.  She admits to MJ prior to conception.  CSW explained  hospital drug testing policy & pt verbalized an understanding.  UDS is negative, meconium results are pending.  Pt has all the necessary supplies for the infant & appears to be bonding well.  She lives with her grandmother, who she identified as her primary support person.   CSW will continue to monitor drug screen results & make a referral if needed.      VI SOCIAL WORK PLAN Social Work Plan  No Further Intervention Required / No Barriers to Discharge   Type of pt/family education:   If child protective services report - county:   If child protective services report - date:   Information/referral to community resources comment:   Other social work plan:

## 2013-12-08 NOTE — Progress Notes (Signed)
Ur chart review completed.  

## 2013-12-08 NOTE — Progress Notes (Signed)
Post Partum Day 1 Subjective: no complaints and voiding.  Pumping breast, difficult latch with baby.    Objective: Blood pressure 95/59, pulse 78, temperature 98.4 F (36.9 C), temperature source Oral, resp. rate 16, height 5\' 5"  (1.651 m), weight 65.318 kg (144 lb), SpO2 99.00%, unknown if currently breastfeeding.  Physical Exam:  Filed Vitals:   12/08/13 0410  BP: 95/59  Pulse: 78  Temp: 98.4 F (36.9 C)  Resp: 16   General: alert, cooperative and appears stated age Lochia: appropriate Uterine Fundus: firm Incision: n/a DVT Evaluation: No evidence of DVT seen on physical exam. Negative Homan's sign.  Recent Labs  12/07/13 1250  HGB 10.5*  HCT 31.0*    Assessment/Plan: Plan for discharge tomorrow   LOS: 1 day   [name] Wood 12/08/2013, 7:38 AM

## 2013-12-09 ENCOUNTER — Encounter: Payer: Self-pay | Admitting: *Deleted

## 2013-12-09 ENCOUNTER — Other Ambulatory Visit: Payer: Medicaid Other

## 2013-12-09 ENCOUNTER — Other Ambulatory Visit: Payer: Medicaid Other | Admitting: Obstetrics & Gynecology

## 2013-12-09 NOTE — Discharge Summary (Signed)
Obstetric Discharge Summary Reason for Admission: onset of labor Prenatal Procedures: none Intrapartum Procedures: spontaneous vaginal delivery Postpartum Procedures: none Complications-Operative and Postpartum: none Hemoglobin  Date Value Ref Range Status  12/07/2013 10.5* 12.0 - 15.0 g/dL Final     HCT  Date Value Ref Range Status  12/07/2013 31.0* 36.0 - 46.0 % Final    Physical Exam:  General: alert, cooperative and no distress Lochia: appropriate Uterine Fundus: firm Incision: n/a DVT Evaluation: No evidence of DVT seen on physical exam. Negative Homan's sign.  Discharge Diagnoses: Term Pregnancy-delivered, IUGR  Discharge Information: Date: 12/09/2013 Activity: pelvic rest Diet: routine Medications: None Condition: stable Instructions: refer to practice specific booklet Discharge to: home Follow-up Information   Follow up with Lazaro Arms, MD. (you need to make a postpartum visit 6 weeks after your delivery)    Specialties:  Obstetrics and Gynecology, Radiology   Contact information:   [address] [place] [address] [phone]       Newborn Data: Live born female  Birth Weight: 5 lb 4.4 oz (2393 g) APGAR: 8, 9  Home with mother.  [name] Wood 12/09/2013, 7:19 AM  I examined pt and agree with documentation above and resident plan of care. Eino Farber Paul Half, CNM

## 2013-12-09 NOTE — Discharge Instructions (Signed)

## 2013-12-09 NOTE — Lactation Note (Signed)
This note was copied from the chart of [name] Wood. Lactation Consultation Note  Patient Name: [name] Wood [id number] Date: 12/09/2013 Reason for consult: Follow-up assessment Per mom baby recently fed from a bottle formula and stooled. Per mom plans to breast and bottle, also plans to just use hand pump. Mom already has a hand pump and is aware of how to use it. LC reviewed engorgement prevention and tx . Mother informed of post-discharge support and given phone number to the lactation department, including services for phone call assistance; out-patient appointments; and breastfeeding support group. List of other breastfeeding resources in the community given in the handout. Encouraged mother to call for problems or concerns related to breastfeeding. LC suggested to mom to call WIC to become active , per mom has had WIC in the past.  Maternal Data Formula Feeding for Exclusion: No  Feeding    LATCH Score/Interventions                Intervention(s): Breastfeeding basics reviewed     Lactation Tools Discussed/Used Tools: Pump Breast pump type: Manual WIC Program: No (per mom )   Consult Status Consult Status: Complete    [name] Wood 12/09/2013, 9:43 AM

## 2013-12-10 NOTE — Discharge Summary (Signed)
Attestation of Attending Supervision of Advanced Practitioner (PA/CNM/NP): Evaluation and management procedures were performed by the Advanced Practitioner under my supervision and collaboration.  I have reviewed the Advanced Practitioner's note and chart, and I agree with the management and plan.  Pheonix Wisby, MD, FACOG Attending Obstetrician & Gynecologist Faculty Practice, [facility]  

## 2014-01-08 ENCOUNTER — Ambulatory Visit (INDEPENDENT_AMBULATORY_CARE_PROVIDER_SITE_OTHER): Payer: Medicaid Other | Admitting: Advanced Practice Midwife

## 2014-01-08 ENCOUNTER — Encounter: Payer: Self-pay | Admitting: Advanced Practice Midwife

## 2014-01-08 NOTE — Progress Notes (Signed)
  [name] Wood is a 23 y.o. who presents for a postpartum visit. She is 4 weeks postpartum following a spontaneous vaginal delivery. I have fully reviewed the prenatal and intrapartum course. The delivery was at 37.4 gestational weeks.  Anesthesia: epidural. Postpartum course has been uneventful. Baby's course has been uneventful. Baby is feeding by breast. Bleeding: no bleeding. Bowel function is normal. Bladder function is normal. Patient is not sexually active. Contraception method is none. Postpartum depression screening: negative.    Review of Systems   Constitutional: Negative for fever and chills Eyes: Negative for visual disturbances Respiratory: Negative for shortness of breath, dyspnea Cardiovascular: Negative for chest pain or palpitations  Gastrointestinal: Negative for vomiting, diarrhea and constipation Genitourinary: Negative for dysuria and urgency Musculoskeletal: Negative for back pain, joint pain, myalgias  Neurological: Negative for dizziness and headaches   Objective:     Filed Vitals:   01/08/14 1033  BP: 138/78   General:  alert, cooperative and no distress   Breasts:  negative  Lungs: clear to auscultation bilaterally  Heart:  regular rate and rhythm  Abdomen: Soft, nontender   Vulva:  normal  Vagina: normal vagina  Cervix:  closed  Corpus: Well involuted     Rectal Exam: no hemorrhoids        Assessment:    normal postpartum exam.  Plan:    1. Contraception: Nexplanon 2. Follow up in:  6/30/ or as needed.

## 2014-01-13 ENCOUNTER — Encounter: Payer: Medicaid Other | Admitting: Advanced Practice Midwife

## 2014-01-19 ENCOUNTER — Encounter: Payer: Self-pay | Admitting: *Deleted

## 2014-01-19 ENCOUNTER — Encounter: Payer: Medicaid Other | Admitting: Obstetrics and Gynecology

## 2014-01-21 ENCOUNTER — Ambulatory Visit: Payer: Medicaid Other | Admitting: Obstetrics and Gynecology

## 2014-01-21 VITALS — BP 108/60

## 2014-01-21 DIAGNOSIS — Z3200 Encounter for pregnancy test, result unknown: Secondary | ICD-10-CM

## 2014-01-21 NOTE — Progress Notes (Signed)
Patient ID: [name], female   DOB: [birth year], 23 y.o.   MRN: [id number] Pt here today for Nexplanon insertion but had sex 2 days ago. JVF informed and pt was rescheduled to come back in a week. QHCG in the a.m. And then Nexplanon in p.m. If negative.

## 2014-01-28 ENCOUNTER — Encounter: Payer: Self-pay | Admitting: Obstetrics and Gynecology

## 2014-01-28 ENCOUNTER — Ambulatory Visit (INDEPENDENT_AMBULATORY_CARE_PROVIDER_SITE_OTHER): Payer: Medicaid Other | Admitting: Obstetrics and Gynecology

## 2014-01-28 ENCOUNTER — Other Ambulatory Visit: Payer: Medicaid Other

## 2014-01-28 VITALS — BP 120/82 | Ht 65.0 in | Wt 143.0 lb

## 2014-01-28 DIAGNOSIS — Z30017 Encounter for initial prescription of implantable subdermal contraceptive: Secondary | ICD-10-CM | POA: Insufficient documentation

## 2014-01-28 DIAGNOSIS — O09893 Supervision of other high risk pregnancies, third trimester: Secondary | ICD-10-CM

## 2014-01-28 DIAGNOSIS — Z3202 Encounter for pregnancy test, result negative: Secondary | ICD-10-CM

## 2014-01-28 DIAGNOSIS — Z3200 Encounter for pregnancy test, result unknown: Secondary | ICD-10-CM

## 2014-01-28 LAB — POCT URINE PREGNANCY: Preg Test, Ur: NEGATIVE

## 2014-01-28 NOTE — Patient Instructions (Signed)
Etonogestrel implant What is this medicine? ETONOGESTREL (et oh noe JES trel) is a contraceptive (birth control) device. It is used to prevent pregnancy. It can be used for up to 3 years. This medicine may be used for other purposes; ask your health care provider or pharmacist if you have questions. COMMON BRAND NAME(S): Implanon, Nexplanon What should I tell my health care provider before I take this medicine? They need to know if you have any of these conditions: -abnormal vaginal bleeding -blood vessel disease or blood clots -cancer of the breast, cervix, or liver -depression -diabetes -gallbladder disease -headaches -heart disease or recent heart attack -high blood pressure -high cholesterol -kidney disease -liver disease -renal disease -seizures -tobacco smoker -an unusual or allergic reaction to etonogestrel, other hormones, anesthetics or antiseptics, medicines, foods, dyes, or preservatives -pregnant or trying to get pregnant -breast-feeding How should I use this medicine? This device is inserted just under the skin on the inner side of your upper arm by a health care professional. Talk to your pediatrician regarding the use of this medicine in children. Special care may be needed. Overdosage: If you think you've taken too much of this medicine contact a poison control center or emergency room at once. Overdosage: If you think you have taken too much of this medicine contact a poison control center or emergency room at once. NOTE: This medicine is only for you. Do not share this medicine with others. What if I miss a dose? This does not apply. What may interact with this medicine? Do not take this medicine with any of the following medications: -amprenavir -bosentan -fosamprenavir This medicine may also interact with the following medications: -barbiturate medicines for inducing sleep or treating seizures -certain medicines for fungal infections like ketoconazole and  itraconazole -griseofulvin -medicines to treat seizures like carbamazepine, felbamate, oxcarbazepine, phenytoin, topiramate -modafinil -phenylbutazone -rifampin -some medicines to treat HIV infection like atazanavir, indinavir, lopinavir, nelfinavir, tipranavir, ritonavir -St. Gwendalyn Mcgonagle's wort This list may not describe all possible interactions. Give your health care provider a list of all the medicines, herbs, non-prescription drugs, or dietary supplements you use. Also tell them if you smoke, drink alcohol, or use illegal drugs. Some items may interact with your medicine. What should I watch for while using this medicine? This product does not protect you against HIV infection (AIDS) or other sexually transmitted diseases. You should be able to feel the implant by pressing your fingertips over the skin where it was inserted. Tell your doctor if you cannot feel the implant. What side effects may I notice from receiving this medicine? Side effects that you should report to your doctor or health care professional as soon as possible: -allergic reactions like skin rash, itching or hives, swelling of the face, lips, or tongue -breast lumps -changes in vision -confusion, trouble speaking or understanding -dark urine -depressed mood -general ill feeling or flu-like symptoms -light-colored stools -loss of appetite, nausea -right upper belly pain -severe headaches -severe pain, swelling, or tenderness in the abdomen -shortness of breath, chest pain, swelling in a leg -signs of pregnancy -sudden numbness or weakness of the face, arm or leg -trouble walking, dizziness, loss of balance or coordination -unusual vaginal bleeding, discharge -unusually weak or tired -yellowing of the eyes or skin Side effects that usually do not require medical attention (Report these to your doctor or health care professional if they continue or are bothersome.): -acne -breast pain -changes in  weight -cough -fever or chills -headache -irregular menstrual bleeding -itching, burning, and   vaginal discharge -pain or difficulty passing urine -sore throat This list may not describe all possible side effects. Call your doctor for medical advice about side effects. You may report side effects to FDA at 1-800-FDA-1088. Where should I keep my medicine? This drug is given in a hospital or clinic and will not be stored at home. NOTE: This sheet is a summary. It may not cover all possible information. If you have questions about this medicine, talk to your doctor, pharmacist, or health care provider.  2015, Elsevier/Gold Standard. (2012-01-08 15:37:45)  

## 2014-01-28 NOTE — Progress Notes (Deleted)
[name] Wood is a 23 y.o. who presents for a postpartum visit. She is {1-10:13787} weeks postpartum following a {delivery:12449}. I have fully reviewed the prenatal and intrapartum course. The delivery was at *** gestational weeks.  Anesthesia: {anesthesia types:812}. Postpartum course has been ***. Baby's course has been uneventful. Baby is feeding by ***. Bleeding: {vag bleed:12292}. Bowel function is normal. Bladder function is normal. Patient {is/is not:9024} sexually active. Contraception method is {contraceptive method:5051}. Postpartum depression screening: {neg default:13464::"negative"}.  She reports her LMNP was 01/28/14.  @ROSSHORT @  Objective:     Filed Vitals:   01/28/14 1504  BP: 120/82   General:  alert, cooperative and no distress   Breasts:  negative  Lungs: clear to auscultation bilaterally  Heart:  regular rate and rhythm  Abdomen: Soft, nontender   Vulva:  normal  Vagina: normal vagina  Cervix:  closed  Corpus: Well involuted     Rectal Exam: *** hemorrhoids        Assessment:    *** postpartum exam.  Plan:    1. Contraception: {method:5051} 2. Follow up in: {1-10:13787} {time; units:19136} or as needed.   [name] Wood is a 23 y.o. who presents for a postpartum visit. She is {1-10:13787} weeks postpartum following a {delivery:12449}. I have fully reviewed the prenatal and intrapartum course. The delivery was at *** gestational weeks.  Anesthesia: {anesthesia types:812}. Postpartum course has been ***. Baby's course has been uneventful. Baby is feeding by ***. Bleeding: {vag bleed:12292}. Bowel function is normal. Bladder function is normal. Patient {is/is not:9024} sexually active. Contraception method is {contraceptive method:5051}. Postpartum depression screening: {neg default:13464::"negative"}.    @ROSSHORT @  Objective:     Filed Vitals:   01/28/14 1504  BP: 120/82   General:  alert, cooperative and no distress   Breasts:  negative  Lungs:  clear to auscultation bilaterally  Heart:  regular rate and rhythm  Abdomen: Soft, nontender   Vulva:  normal  Vagina: normal vagina  Cervix:  closed  Corpus: Well involuted     Rectal Exam: *** hemorrhoids        Assessment:    *** postpartum exam.  Plan:    1. Contraception: {method:5051} 2. Follow up in: {1-10:13787} {time; units:19136} or as needed.   [name] Wood is a 23 y.o. who presents for a postpartum visit. She is {1-10:13787} weeks postpartum following a {delivery:12449}. I have fully reviewed the prenatal and intrapartum course. The delivery was at *** gestational weeks.  Anesthesia: {anesthesia types:812}. Postpartum course has been ***. Baby's course has been uneventful. Baby is feeding by ***. Bleeding: {vag bleed:12292}. Bowel function is normal. Bladder function is normal. Patient {is/is not:9024} sexually active. Contraception method is {contraceptive method:5051}. Postpartum depression screening: {neg default:13464::"negative"}.    @ROSSHORT @  Objective:     Filed Vitals:   01/28/14 1504  BP: 120/82   General:  alert, cooperative and no distress   Breasts:  negative  Lungs: clear to auscultation bilaterally  Heart:  regular rate and rhythm  Abdomen: Soft, nontender   Vulva:  normal  Vagina: normal vagina  Cervix:  closed  Corpus: Well involuted     Rectal Exam: *** hemorrhoids        Assessment:    *** postpartum exam.  Plan:    1. Contraception: {method:5051} 2. Follow up in: {1-10:13787} {time; units:19136} or as needed.

## 2014-01-28 NOTE — Progress Notes (Signed)
This chart was scribed for Dr. Tilda BurrowJohn Nyellie Yetter V by Yevette EdwardsAngela Bracken, Medical Scribe, on 01/28/14 at 3:33 PM. Dr. Emelda FearFerguson reviewed the chart for accuracy.   GYNECOLOGY CLINIC PROCEDURE NOTE  [name] Wood is a 23 y.o. [id number] here for Nexplanon insertion to her left arm. No GYN concerns.  She reports LNMP 01/28/14.   No other gynecologic concerns.  Nexplanon Insertion Procedure Patient was given informed consent, she signed consent form.  Patient does understand that irregular bleeding is a very common side effect of this medication. She was advised to have backup contraception for one week after placement. Pregnancy test in clinic today was negative.  Appropriate time out taken.  Patient's left arm was prepped and draped in the usual sterile fashion.. The ruler used to measure and mark insertion area.  Patient was prepped with alcohol swab and then injected with 3 ml of 1% lidocaine.  She was prepped with betadine, Nexplanon removed from packaging,  Device confirmed in needle, then inserted full length of needle and withdrawn per handbook instructions. Nexplanon was able to palpated in the patient's arm; patient palpated the insert herself. There was minimal blood loss.  Patient insertion site covered with guaze and a pressure bandage to reduce any bruising.  The patient tolerated the procedure well and was given post procedure instructions.   Christin BachJohn Landy Dunnavant, MD [facility] [place], [place]

## 2014-01-29 ENCOUNTER — Telehealth: Payer: Self-pay | Admitting: Obstetrics and Gynecology

## 2014-01-29 NOTE — Telephone Encounter (Signed)
Spoke with the pt and she needed a note that stated she could return to work tomorrow. Pt aware note will be up front for her to pick .

## 2014-04-13 ENCOUNTER — Ambulatory Visit: Payer: Medicaid Other | Admitting: Adult Health

## 2014-05-18 ENCOUNTER — Encounter: Payer: Self-pay | Admitting: Obstetrics and Gynecology

## 2015-02-25 IMAGING — US US OB LIMITED
1 series · 14 of 20 positions shown · non-contrast
Comparison: none

CLINICAL DATA: Cramping.  Unknown dates.

EXAM:
LIMITED OBSTETRIC ULTRASOUND

[Series 1: us ob limited · 0.18mm/px · 14 of 20 slices shown]
[im 1/20]
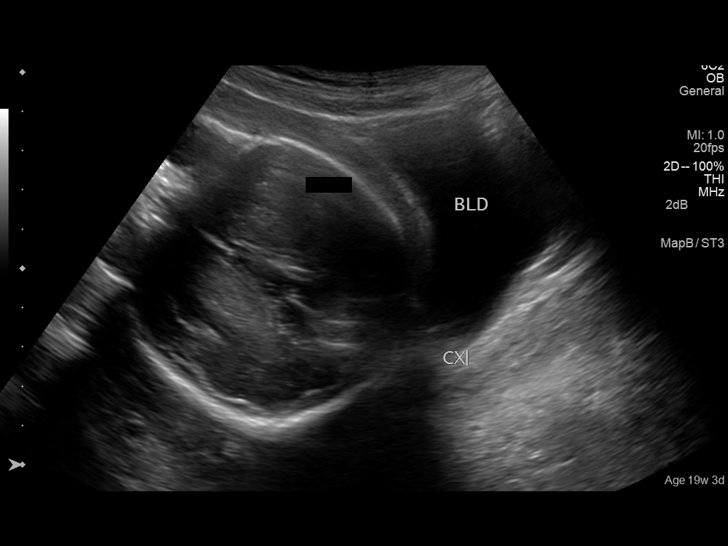
[im 3/20]
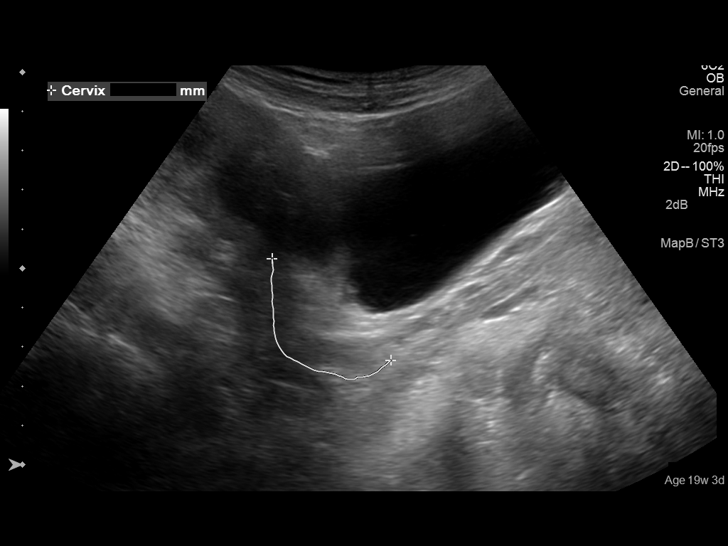
[im 4/20]
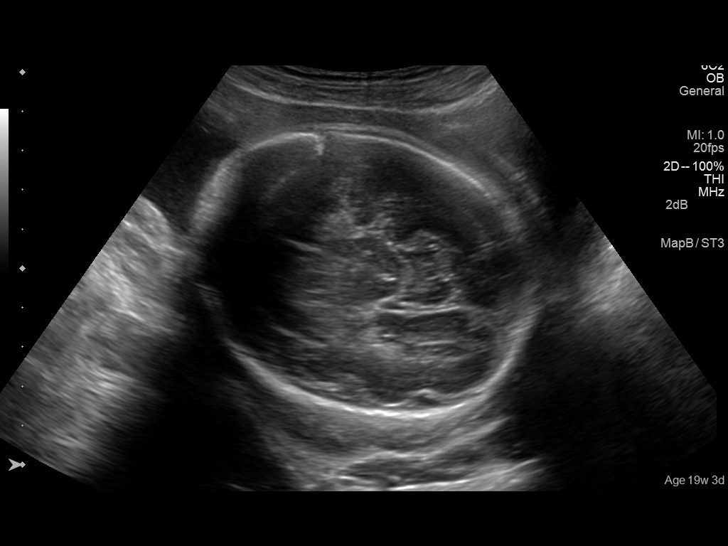
[im 6/20]
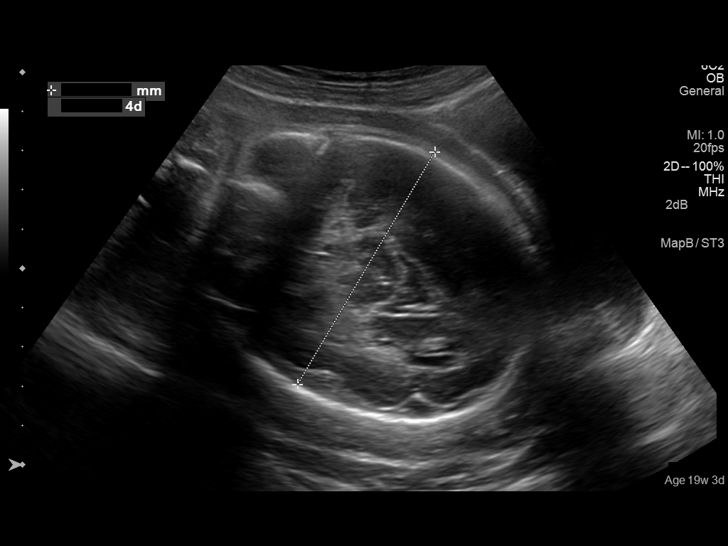
[im 7/20]
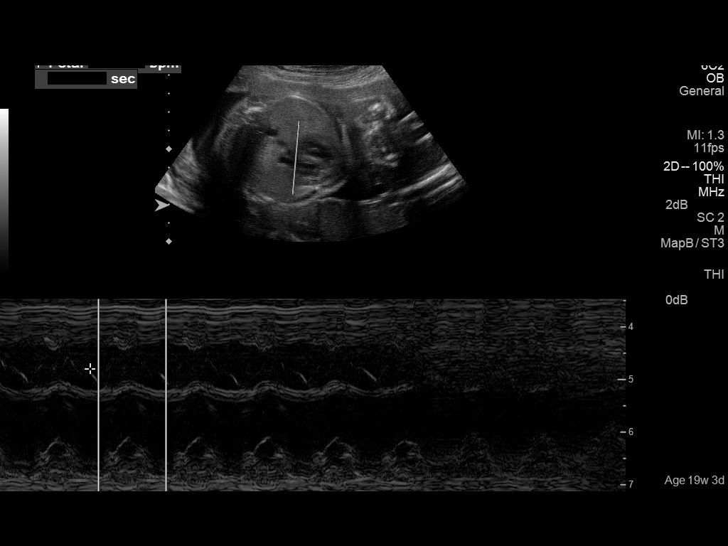
[im 8/20]
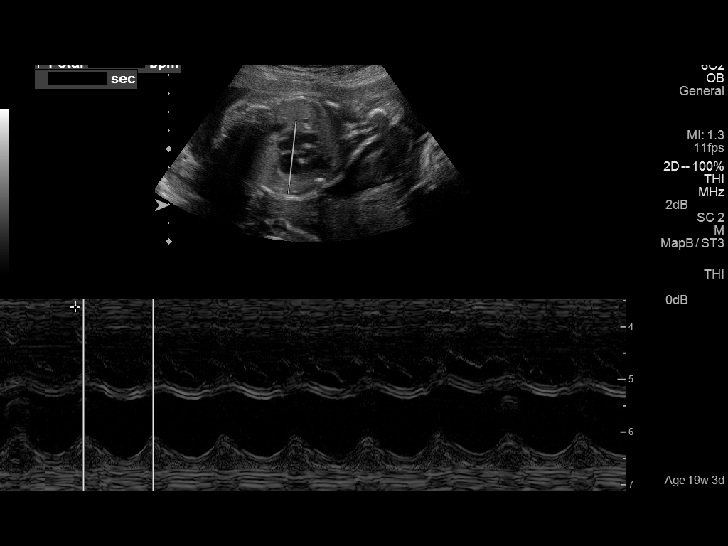
[im 10/20]
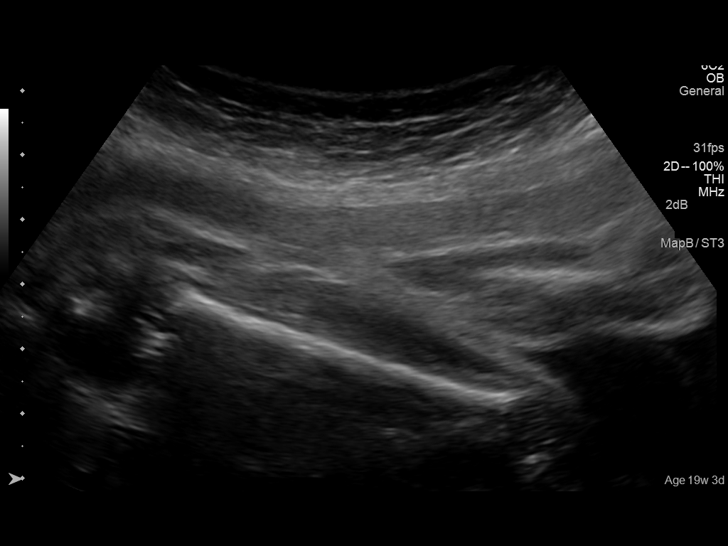
[im 11/20]
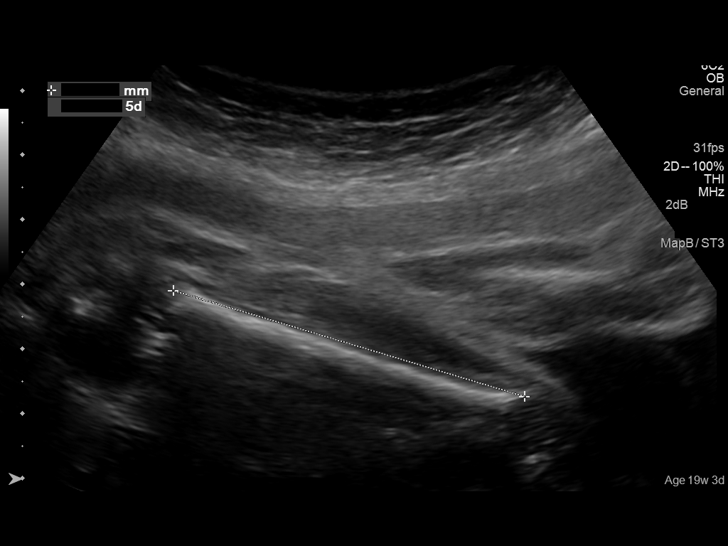
[im 13/20]
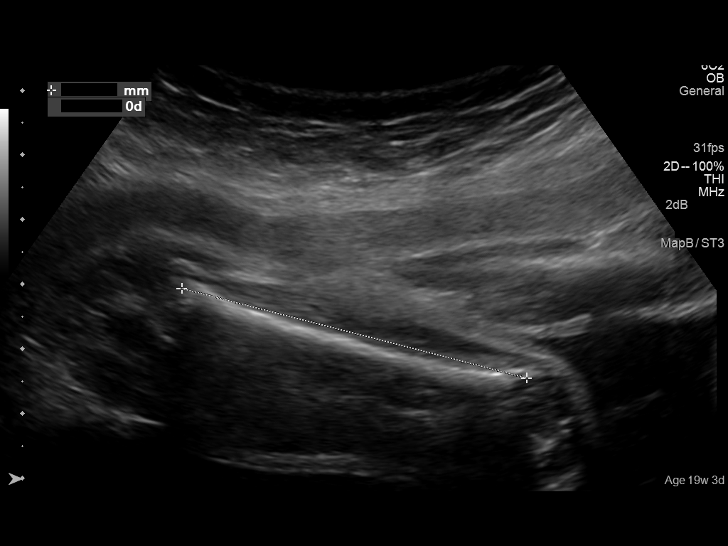
[im 14/20]
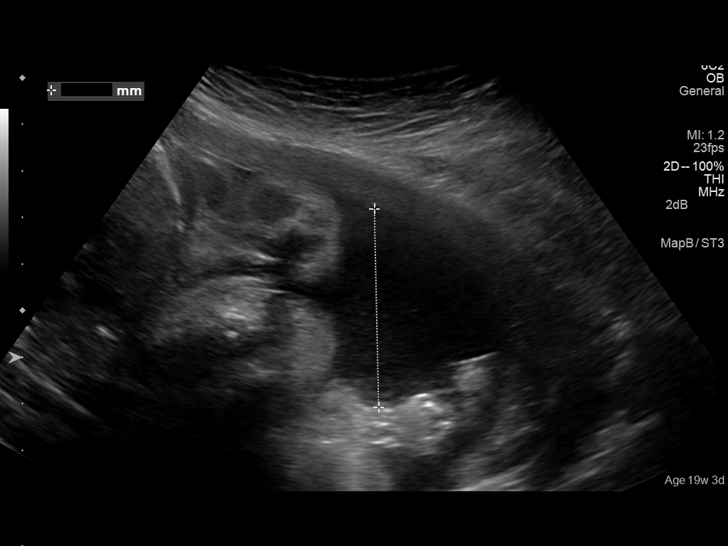
[im 16/20]
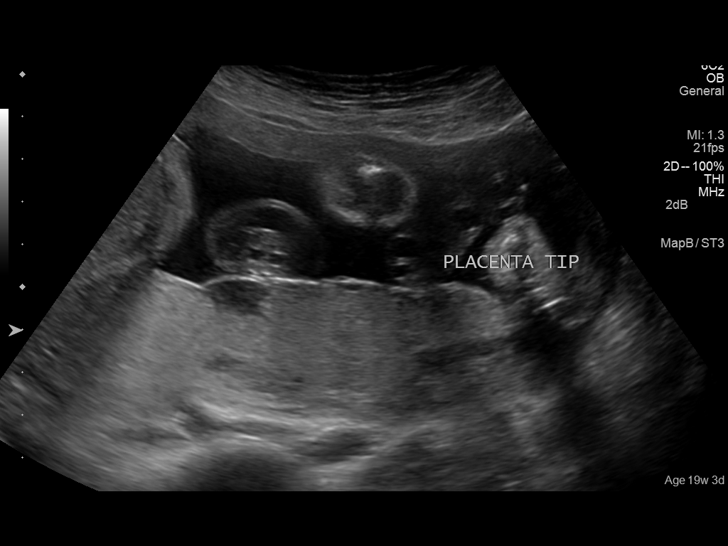
[im 17/20]
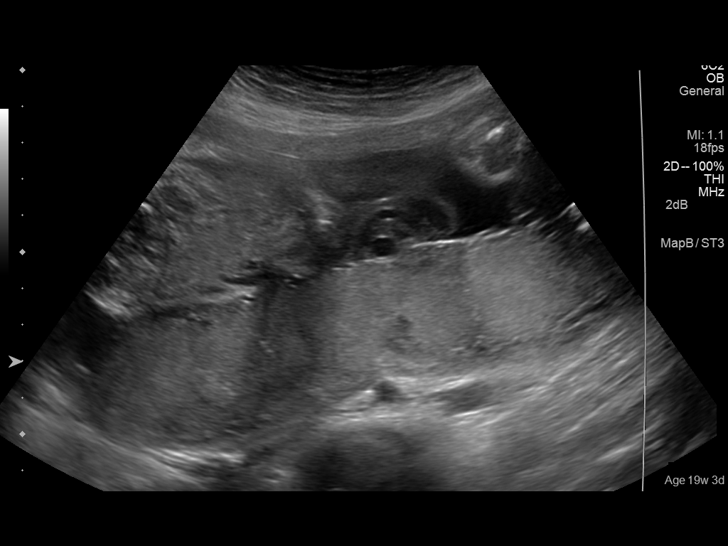
[im 18/20]
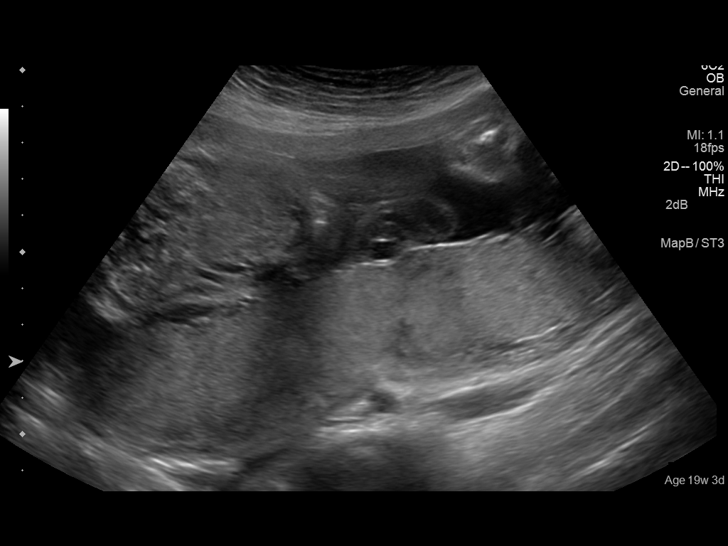
[im 20/20]
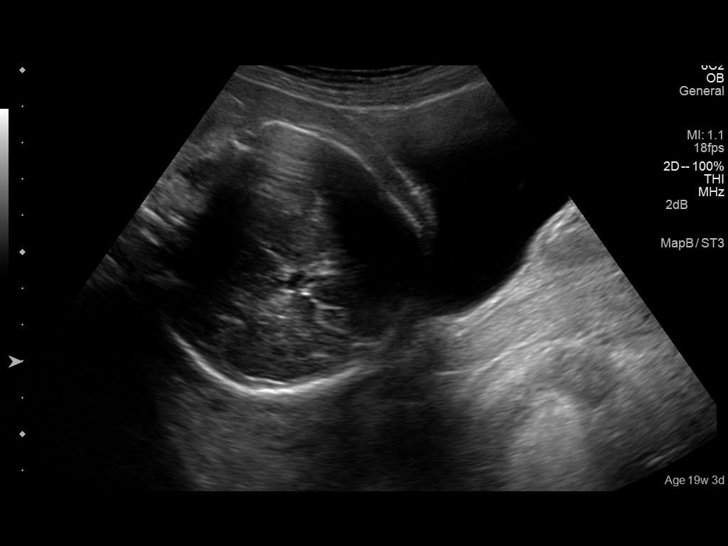

[14 of 20 positions shown; findings below may reference images not displayed]

FINDINGS: Number of Fetuses: 1

Heart Rate:  147 bpm

Movement: Yes

Presentation: Cephalic

Placental Location: Posterior

Previa: No

Amniotic Fluid (Subjective):  Normal

BPD:  68.8 mm 27w  5d

MATERNAL FINDINGS:

Cervix:  Appears closed.

Uterus/Adnexae:  No abnormality visualized.
IMPRESSION: 1. Single living intrauterine gestation.
2. No complications identified.

This exam is performed on an emergent basis and does not
comprehensively evaluate fetal size, dating, or anatomy; follow-up
complete OB US should be considered if further fetal assessment is
warranted.

## 2015-11-19 ENCOUNTER — Encounter: Payer: Self-pay | Admitting: Obstetrics & Gynecology

## 2015-11-19 ENCOUNTER — Other Ambulatory Visit (HOSPITAL_COMMUNITY)
Admission: RE | Admit: 2015-11-19 | Discharge: 2015-11-19 | Disposition: A | Discharge status: Home / Self Care | Payer: MEDICAID | Source: Ambulatory Visit | Attending: Obstetrics & Gynecology | Admitting: Obstetrics & Gynecology

## 2015-11-19 ENCOUNTER — Ambulatory Visit (INDEPENDENT_AMBULATORY_CARE_PROVIDER_SITE_OTHER): Payer: BLUE CROSS/BLUE SHIELD | Admitting: Obstetrics & Gynecology

## 2015-11-19 VITALS — BP 100/70 | HR 72 | Ht 66.0 in | Wt 146.5 lb

## 2015-11-19 DIAGNOSIS — Z113 Encounter for screening for infections with a predominantly sexual mode of transmission: Secondary | ICD-10-CM | POA: Insufficient documentation

## 2015-11-19 DIAGNOSIS — Z01419 Encounter for gynecological examination (general) (routine) without abnormal findings: Secondary | ICD-10-CM | POA: Diagnosis not present

## 2015-11-19 MED ORDER — MEGESTROL ACETATE 40 MG PO TABS: 40.0000 mg | ORAL_TABLET | Freq: Every day | ORAL | Status: DC

## 2015-11-19 NOTE — Progress Notes (Signed)
Patient ID: [name] CanavanMyra A Herberger, female   DOB: [birth year], 25 y.o.   MRN: [id number] Subjective:     [name] Wood is a 25 y.o. female here for a routine exam.  Patient's last menstrual period was 10/23/2015. [id number] Birth Control Method:  nexplanon Menstrual Calendar(currently): irregualr  Current complaints: none.   Current acute medical issues:  none   Recent Gynecologic History Patient's last menstrual period was 10/23/2015. Last Pap: 2015,  normal Last mammogram: ,    Past Medical History  Diagnosis Date  . History of chlamydia     Pt and partner was treated.    History reviewed. No pertinent past surgical history.  OB History    Gravida Para Term Preterm AB TAB SAB Ectopic Multiple Living   2 2 2       2       Social History   Social History  . Marital Status: Single    Spouse Name: N/A  . Number of Children: N/A  . Years of Education: N/A   Social History Main Topics  . Smoking status: Never Smoker   . Smokeless tobacco: Never Used  . Alcohol Use: No     Comment: none since positive preg. test.  . Drug Use: No  . Sexual Activity: Not Currently    Birth Control/ Protection: Implant   Other Topics Concern  . None   Social History Narrative    Family History  Problem Relation Age of Onset  . Coronary artery disease Other   . Hypertension Other   . Diabetes Other   . Thyroid disease Other   . Lupus Maternal Grandmother      Current outpatient prescriptions:  .  etonogestrel (NEXPLANON) 68 MG IMPL implant, 1 each by Subdermal route once., Disp: , Rfl:  .  megestrol (MEGACE) 40 MG tablet, Take 1 tablet (40 mg total) by mouth daily., Disp: 30 tablet, Rfl: 11  Review of Systems  Review of Systems  Constitutional: Negative for fever, chills, weight loss, malaise/fatigue and diaphoresis.  HENT: Negative for hearing loss, ear pain, nosebleeds, congestion, sore throat, neck pain, tinnitus and ear discharge.   Eyes: Negative for blurred vision, double vision,  photophobia, pain, discharge and redness.  Respiratory: Negative for cough, hemoptysis, sputum production, shortness of breath, wheezing and stridor.   Cardiovascular: Negative for chest pain, palpitations, orthopnea, claudication, leg swelling and PND.  Gastrointestinal: negative for abdominal pain. Negative for heartburn, nausea, vomiting, diarrhea, constipation, blood in stool and melena.  Genitourinary: Negative for dysuria, urgency, frequency, hematuria and flank pain.  Musculoskeletal: Negative for myalgias, back pain, joint pain and falls.  Skin: Negative for itching and rash.  Neurological: Negative for dizziness, tingling, tremors, sensory change, speech change, focal weakness, seizures, loss of consciousness, weakness and headaches.  Endo/Heme/Allergies: Negative for environmental allergies and polydipsia. Does not bruise/bleed easily.  Psychiatric/Behavioral: Negative for depression, suicidal ideas, hallucinations, memory loss and substance abuse. The patient is not nervous/anxious and does not have insomnia.        Objective:  Blood pressure 100/70, pulse 72, height 5\' 6"  (1.676 m), weight 146 lb 8 oz (66.452 kg), last menstrual period 10/23/2015, not currently breastfeeding.   Physical Exam  Vitals reviewed. Constitutional: She is oriented to person, place, and time. She appears well-developed and well-nourished.  HENT:  Head: Normocephalic and atraumatic.        Right Ear: External ear normal.  Left Ear: External ear normal.  Nose: Nose normal.  Mouth/Throat: Oropharynx is clear and moist.  Eyes: Conjunctivae and EOM are normal. Pupils are equal, round, and reactive to light. Right eye exhibits no discharge. Left eye exhibits no discharge. No scleral icterus.  Neck: Normal range of motion. Neck supple. No tracheal deviation present. No thyromegaly present.  Cardiovascular: Normal rate, regular rhythm, normal heart sounds and intact distal pulses.  Exam reveals no gallop and no  friction rub.   No murmur heard. Respiratory: Effort normal and breath sounds normal. No respiratory distress. She has no wheezes. She has no rales. She exhibits no tenderness.  GI: Soft. Bowel sounds are normal. She exhibits no distension and no mass. There is no tenderness. There is no rebound and no guarding.  Genitourinary:  Breasts no masses skin changes or nipple changes bilaterally      Vulva is normal without lesions Vagina is pink moist without discharge Cervix normal in appearance and pap is done Uterus is normal size shape and contour Adnexa is negative with normal sized ovaries   Musculoskeletal: Normal range of motion. She exhibits no edema and no tenderness.  Neurological: She is alert and oriented to person, place, and time. She has normal reflexes. She displays normal reflexes. No cranial nerve deficit. She exhibits normal muscle tone. Coordination normal.  Skin: Skin is warm and dry. No rash noted. No erythema. No pallor.  Psychiatric: She has a normal mood and affect. Her behavior is normal. Judgment and thought content normal.       Medications Ordered at today's visit: Meds ordered this encounter  Medications  . etonogestrel (NEXPLANON) 68 MG IMPL implant    Sig: 1 each by Subdermal route once.  . megestrol (MEGACE) 40 MG tablet    Sig: Take 1 tablet (40 mg total) by mouth daily.    Dispense:  30 tablet    Refill:  11    Other orders placed at today's visit: No orders of the defined types were placed in this encounter.      Assessment:    Healthy female exam.    Plan:    Contraception: Nexplanon. Follow up in: 1 year.

## 2015-11-23 LAB — CYTOLOGY - PAP

## 2015-12-18 ENCOUNTER — Encounter (HOSPITAL_COMMUNITY): Payer: Self-pay | Admitting: Emergency Medicine

## 2015-12-18 ENCOUNTER — Emergency Department (HOSPITAL_COMMUNITY)
Admission: EM | Admit: 2015-12-18 | Discharge: 2015-12-18 | Disposition: A | Discharge status: Home / Self Care | Payer: BLUE CROSS/BLUE SHIELD | Attending: Emergency Medicine | Admitting: Emergency Medicine

## 2015-12-18 DIAGNOSIS — Z79899 Other long term (current) drug therapy: Secondary | ICD-10-CM | POA: Insufficient documentation

## 2015-12-18 DIAGNOSIS — J028 Acute pharyngitis due to other specified organisms: Secondary | ICD-10-CM

## 2015-12-18 DIAGNOSIS — E876 Hypokalemia: Secondary | ICD-10-CM

## 2015-12-18 DIAGNOSIS — R112 Nausea with vomiting, unspecified: Secondary | ICD-10-CM | POA: Diagnosis present

## 2015-12-18 DIAGNOSIS — B9689 Other specified bacterial agents as the cause of diseases classified elsewhere: Secondary | ICD-10-CM

## 2015-12-18 DIAGNOSIS — J029 Acute pharyngitis, unspecified: Secondary | ICD-10-CM | POA: Insufficient documentation

## 2015-12-18 LAB — COMPREHENSIVE METABOLIC PANEL
ALBUMIN: 3.7 g/dL (ref 3.5–5.0)
ALK PHOS: 66 U/L (ref 38–126)
ALT: 21 U/L (ref 14–54)
AST: 29 U/L (ref 15–41)
Anion gap: 9 (ref 5–15)
BILIRUBIN TOTAL: 0.4 mg/dL (ref 0.3–1.2)
BUN: 6 mg/dL (ref 6–20)
CALCIUM: 8 mg/dL — AB (ref 8.9–10.3)
CO2: 25 mmol/L (ref 22–32)
CREATININE: 0.71 mg/dL (ref 0.44–1.00)
Chloride: 99 mmol/L — ABNORMAL LOW (ref 101–111)
GFR calc Af Amer: >60 mL/min (ref >60)
GFR calc non Af Amer: >60 mL/min (ref >60)
GLUCOSE: 109 mg/dL — AB (ref 65–99)
Potassium: 2.4 mmol/L — CL (ref 3.5–5.1)
Sodium: 133 mmol/L — ABNORMAL LOW (ref 135–145)
TOTAL PROTEIN: 7.2 g/dL (ref 6.5–8.1)

## 2015-12-18 LAB — CBC WITH DIFFERENTIAL/PLATELET
Basophils Absolute: 0 10*3/uL (ref 0.0–0.1)
Basophils Relative: 0 %
EOS PCT: 0 %
Eosinophils Absolute: 0 10*3/uL (ref 0.0–0.7)
HCT: 35.3 % — ABNORMAL LOW (ref 36.0–46.0)
HEMOGLOBIN: 12.2 g/dL (ref 12.0–15.0)
LYMPHS ABS: 0.9 10*3/uL (ref 0.7–4.0)
LYMPHS PCT: 5 %
MCH: 30.2 pg (ref 26.0–34.0)
MCHC: 34.6 g/dL (ref 30.0–36.0)
MCV: 87.4 fL (ref 78.0–100.0)
MONOS PCT: 10 %
Monocytes Absolute: 1.8 10*3/uL — ABNORMAL HIGH (ref 0.1–1.0)
NEUTROS PCT: 85 %
Neutro Abs: 14.8 10*3/uL — ABNORMAL HIGH (ref 1.7–7.7)
Platelets: 285 10*3/uL (ref 150–400)
RBC: 4.04 MIL/uL (ref 3.87–5.11)
RDW: 12.7 % (ref 11.5–15.5)
WBC: 17.5 10*3/uL — AB (ref 4.0–10.5)

## 2015-12-18 LAB — RAPID STREP SCREEN (MED CTR MEBANE ONLY): Streptococcus, Group A Screen (Direct): NEGATIVE

## 2015-12-18 LAB — URINE MICROSCOPIC-ADD ON

## 2015-12-18 LAB — URINALYSIS, ROUTINE W REFLEX MICROSCOPIC
GLUCOSE, UA: NEGATIVE mg/dL
Ketones, ur: 40 mg/dL — AB
Leukocytes, UA: NEGATIVE
Nitrite: NEGATIVE
Protein, ur: 100 mg/dL — AB
pH: 5.5 (ref 5.0–8.0)

## 2015-12-18 LAB — LIPASE, BLOOD: Lipase: 12 U/L (ref 11–51)

## 2015-12-18 LAB — MAGNESIUM: MAGNESIUM: 1.2 mg/dL — AB (ref 1.7–2.4)

## 2015-12-18 LAB — POC URINE PREG, ED: Preg Test, Ur: NEGATIVE

## 2015-12-18 LAB — MONONUCLEOSIS SCREEN: Mono Screen: NEGATIVE

## 2015-12-18 MED ORDER — MAGNESIUM SULFATE 2 GM/50ML IV SOLN
2.0000 g | INTRAVENOUS | Status: AC
  Administered 2015-12-18: 2 g via INTRAVENOUS
  Filled 2015-12-18: qty 50

## 2015-12-18 MED ORDER — SODIUM CHLORIDE 0.9 % IV BOLUS (SEPSIS)
1000.0000 mL | Freq: Once | INTRAVENOUS | Status: AC
  Administered 2015-12-18: 1000 mL via INTRAVENOUS

## 2015-12-18 MED ORDER — DEXAMETHASONE SODIUM PHOSPHATE 10 MG/ML IJ SOLN
10.0000 mg | Freq: Once | INTRAMUSCULAR | Status: AC
  Administered 2015-12-18: 10 mg via INTRAVENOUS
  Filled 2015-12-18: qty 1

## 2015-12-18 MED ORDER — ONDANSETRON HCL 4 MG/2ML IJ SOLN
4.0000 mg | Freq: Once | INTRAMUSCULAR | Status: AC
  Administered 2015-12-18: 4 mg via INTRAVENOUS
  Filled 2015-12-18: qty 2

## 2015-12-18 MED ORDER — ACETAMINOPHEN 500 MG PO TABS
1000.0000 mg | ORAL_TABLET | Freq: Once | ORAL | Status: AC
  Administered 2015-12-18: 1000 mg via ORAL
  Filled 2015-12-18: qty 2

## 2015-12-18 MED ORDER — ONDANSETRON 4 MG PO TBDP: ORAL_TABLET | ORAL | Status: DC

## 2015-12-18 MED ORDER — POTASSIUM CHLORIDE CRYS ER 20 MEQ PO TBCR: 20.0000 meq | EXTENDED_RELEASE_TABLET | Freq: Every day | ORAL | Status: DC

## 2015-12-18 MED ORDER — PENICILLIN G BENZATHINE 1200000 UNIT/2ML IM SUSP
1.2000 10*6.[IU] | Freq: Once | INTRAMUSCULAR | Status: AC
  Administered 2015-12-18: 1.2 10*6.[IU] via INTRAMUSCULAR
  Filled 2015-12-18: qty 2

## 2015-12-18 MED ORDER — POTASSIUM CHLORIDE 10 MEQ/100ML IV SOLN
10.0000 meq | INTRAVENOUS | Status: AC
  Administered 2015-12-18 (×3): 10 meq via INTRAVENOUS
  Filled 2015-12-18 (×3): qty 100

## 2015-12-18 MED ORDER — POTASSIUM CHLORIDE CRYS ER 20 MEQ PO TBCR
80.0000 meq | EXTENDED_RELEASE_TABLET | Freq: Once | ORAL | Status: AC
  Administered 2015-12-18: 80 meq via ORAL
  Filled 2015-12-18: qty 4

## 2015-12-18 MED ORDER — KETOROLAC TROMETHAMINE 30 MG/ML IJ SOLN
30.0000 mg | Freq: Once | INTRAMUSCULAR | Status: AC
  Administered 2015-12-18: 30 mg via INTRAVENOUS
  Filled 2015-12-18: qty 1

## 2015-12-18 NOTE — ED Notes (Signed)
Pt refused rectal temp, repeat oral temp done.

## 2015-12-18 NOTE — ED Notes (Signed)
Patient ambulatory to restroom  ?

## 2015-12-18 NOTE — ED Provider Notes (Signed)
CSN: [id number]     Arrival date & time 12/18/15  1709 History   First MD Initiated Contact with Patient 12/18/15 1746     Chief Complaint  Patient presents with  . Emesis     (Consider location/radiation/quality/duration/timing/severity/associated sxs/prior Treatment) The history is provided by the patient and medical records. No language interpreter was used.     [name] Wood is a 25 y.o. female  with no major medical problems presents to the Emergency Department complaining of gradual, persistent, progressively worsening "illness" onset yesterday evening. Associated symptoms include ST, subjective fevers, body aches.  Pt reports nausea and emesis onset 2 hours ago without abd pain.  Pt reports taking Theraflu today without relief. Nothing makes it better or worse.  Pt denies headache, neck pain, chest pain, SOB, abd pain, diarrhea, dysuria.   LMP: current. Birth Control: Nexplanon   Past Medical History  Diagnosis Date  . History of chlamydia     Pt and partner was treated.   History reviewed. No pertinent past surgical history. Family History  Problem Relation Age of Onset  . Coronary artery disease Other   . Hypertension Other   . Diabetes Other   . Thyroid disease Other   . Lupus Maternal Grandmother    Social History  Substance Use Topics  . Smoking status: Never Smoker   . Smokeless tobacco: Never Used  . Alcohol Use: No     Comment: none since positive preg. test.   OB History    Gravida Para Term Preterm AB TAB SAB Ectopic Multiple Living   2 2 2       2      Review of Systems  Constitutional: Positive for fever and fatigue. Negative for diaphoresis, appetite change and unexpected weight change.  HENT: Positive for sore throat. Negative for mouth sores.   Eyes: Negative for visual disturbance.  Respiratory: Negative for cough, chest tightness, shortness of breath and wheezing.   Cardiovascular: Negative for chest pain.  Gastrointestinal: Positive for nausea and  vomiting. Negative for abdominal pain, diarrhea and constipation.  Endocrine: Negative for polydipsia, polyphagia and polyuria.  Genitourinary: Negative for dysuria, urgency, frequency and hematuria.  Musculoskeletal: Positive for myalgias. Negative for back pain, neck pain and neck stiffness.  Skin: Negative for rash.  Allergic/Immunologic: Negative for immunocompromised state.  Neurological: Negative for syncope, light-headedness and headaches.  Hematological: Does not bruise/bleed easily.  Psychiatric/Behavioral: Negative for sleep disturbance. The patient is not nervous/anxious.       Allergies  Review of patient's allergies indicates no known allergies.  Home Medications   Prior to Admission medications   Medication Sig Start Date End Date Taking? Authorizing Provider  Pheniramine-PE-APAP (THERAFLU FLU & SORE THROAT PO) Take 15 mLs by mouth every 4 (four) hours as needed (Cold Symptoms).   Yes Historical Provider, MD  etonogestrel (NEXPLANON) 68 MG IMPL implant 1 each by Subdermal route once.    Historical Provider, MD  megestrol (MEGACE) 40 MG tablet Take 1 tablet (40 mg total) by mouth daily. 11/19/15   Lazaro ArmsLuther H Eure, MD  ondansetron (ZOFRAN ODT) 4 MG disintegrating tablet 4mg  ODT q4 hours prn nausea/vomit 12/18/15   Johnston Maddocks, PA-C  potassium chloride SA (K-DUR,KLOR-CON) 20 MEQ tablet Take 1 tablet (20 mEq total) by mouth daily. 12/18/15   Amethyst Gainer, PA-C   BP 93/50 mmHg  Pulse 81  Temp(Src) 102.8 F (39.3 C) (Oral)  Resp 20  Ht 5\' 5"  (1.651 m)  Wt 68.04 kg  BMI 24.96  kg/m2  SpO2 97%  LMP 12/18/2015 Physical Exam  Constitutional: She appears well-developed and well-nourished. She appears distressed.  Pt sobbing  HENT:  Head: Normocephalic and atraumatic.  Right Ear: Tympanic membrane, external ear and ear canal normal.  Left Ear: Tympanic membrane, external ear and ear canal normal.  Nose: Nose normal. No mucosal edema or rhinorrhea.  Mouth/Throat:  Uvula is midline and mucous membranes are normal. Mucous membranes are not dry. No trismus in the jaw. No uvula swelling. Oropharyngeal exudate, posterior oropharyngeal edema and posterior oropharyngeal erythema present. No tonsillar abscesses.  Posterior oropharynx with erythema, edema and exudate on the tonsils  Eyes: Conjunctivae are normal.  Neck: Normal range of motion, full passive range of motion without pain and phonation normal. No tracheal tenderness, no spinous process tenderness and no muscular tenderness present. No rigidity. No erythema and normal range of motion present. No Brudzinski's sign and no Kernig's sign noted.  Range of motion without pain  No midline or paraspinal tenderness Normal phonation No stridor Handling secretions without difficulty No nuchal rigidity or meningeal signs  Cardiovascular: Regular rhythm, normal heart sounds and intact distal pulses.  Tachycardia present.   Pulses:      Radial pulses are 2+ on the right side, and 2+ on the left side.  Pulmonary/Chest: Effort normal and breath sounds normal. No stridor. No respiratory distress. She has no decreased breath sounds. She has no wheezes.  Equal chest expansion, clear and equal breath sounds without focal wheezes, rhonchi or rales  Abdominal: Soft. Bowel sounds are normal. She exhibits no distension. There is no tenderness. There is no rebound and no guarding.  Musculoskeletal: Normal range of motion.  Lymphadenopathy:       Head (right side): Submandibular and tonsillar adenopathy present. No submental, no preauricular, no posterior auricular and no occipital adenopathy present.       Head (left side): Submandibular and tonsillar adenopathy present. No submental, no preauricular, no posterior auricular and no occipital adenopathy present.    She has cervical adenopathy (anterior, bilateral).       Right cervical: No superficial cervical, no deep cervical and no posterior cervical adenopathy present.       Left cervical: No superficial cervical, no deep cervical and no posterior cervical adenopathy present.  Neurological: She is alert.  Alert and oriented Moves all extremities without ataxia  Skin: Skin is warm and dry. No rash noted. She is not diaphoretic.  Psychiatric: She has a normal mood and affect.  Nursing note and vitals reviewed.   ED Course  Procedures (including critical care time) Labs Review Labs Reviewed  CBC WITH DIFFERENTIAL/PLATELET - Abnormal; Notable for the following:    WBC 17.5 (*)    HCT 35.3 (*)    Neutro Abs 14.8 (*)    Monocytes Absolute 1.8 (*)    All other components within normal limits  URINALYSIS, ROUTINE W REFLEX MICROSCOPIC (NOT AT [facility]) - Abnormal; Notable for the following:    Specific Gravity, Urine >1.030 (*)    Hgb urine dipstick SMALL (*)    Bilirubin Urine SMALL (*)    Ketones, ur 40 (*)    Protein, ur 100 (*)    All other components within normal limits  COMPREHENSIVE METABOLIC PANEL - Abnormal; Notable for the following:    Sodium 133 (*)    Potassium 2.4 (*)    Chloride 99 (*)    Glucose, Bld 109 (*)    Calcium 8.0 (*)    All other components  within normal limits  URINE MICROSCOPIC-ADD ON - Abnormal; Notable for the following:    Squamous Epithelial / LPF 6-30 (*)    Bacteria, UA MANY (*)    All other components within normal limits  MAGNESIUM - Abnormal; Notable for the following:    Magnesium 1.2 (*)    All other components within normal limits  RAPID STREP SCREEN (NOT AT [facility])  CULTURE, GROUP A STREP ([facility])  URINE CULTURE  LIPASE, BLOOD  MONONUCLEOSIS SCREEN  POC URINE PREG, ED     MDM   Final diagnoses:  Hypokalemia  Bacterial pharyngitis  Non-intractable vomiting with nausea, vomiting of unspecified type  Hypomagnesemia   [name] Wood presents with complaints of Sore throat, fever, myalgias. Patient is febrile here in the emergency department and initially tachycardic. No chest pain or shortness of breath.  Labs  show a leukocytosis and significant hypokalemia at 2.4. She has associated hypomagnesemia.  Both have been repleted here in the emergency department. Patient has received both oral and IV potassium.   Her mono and strep screen are negative however her clinical exam and history are consistent with bacterial pharyngitis.  She has been treated with penicillin and Decadron. Toradol has improved her symptoms significantly.  Fluid bolus was given.  Urinalysis shows dehydration and is nitrite and leukocyte negative but has many bacteria.  She denies symptoms. Urine culture was sent. On initial and repeat exam abdomen is soft and nontender. No rebound or guarding.  Pt is now tolerating [employer] hamburgers and french fries without further emesis.  Her vital signs have improved including her tachycardia. She reports she has some better. Discussed typical course of bacterial pharyngitis and reasons to return to the emergency department including inability to swallow, lateralizing pain, persistent fevers or other concerns. She is to follow with her primary care provider within 48 hours.    Dahlia Client Connie Hilgert, PA-C 12/18/15 2238  Rolland Porter, MD 12/28/15 [phone]

## 2015-12-18 NOTE — ED Notes (Signed)
Patient c/o nausea, vomiting, fevers, sore throat and generalized body aches. Per patient started Friday. Denies any diarrhea or urinary symptoms. Per patient taking Theraflu for fevers and body aches with little relief.

## 2015-12-18 NOTE — ED Notes (Signed)
Patient tolerated PO fluid and food

## 2015-12-18 NOTE — ED Notes (Signed)
Patient verbalizes understanding of discharge instructions, prescriptions, home care and follow up care. Patient out of department at this time with family. 

## 2015-12-18 NOTE — Discharge Instructions (Signed)
1. Medications: klor con, zofran, usual home medications 2. Treatment: rest, drink plenty of fluids, advance diet slowly 3. Follow Up: Please followup with your primary doctor in 2-3 days for discussion of your diagnoses and further evaluation after today's visit; if you do not have a primary care doctor use the resource guide provided to find one; Please return to the ER for worsening symptoms    Hypokalemia Hypokalemia means that the amount of potassium in the blood is lower than normal.Potassium is a chemical, called an electrolyte, that helps regulate the amount of fluid in the body. It also stimulates muscle contraction and helps nerves function properly.Most of the body's potassium is inside of cells, and only a very small amount is in the blood. Because the amount in the blood is so small, minor changes can be life-threatening. CAUSES  Antibiotics.  Diarrhea or vomiting.  Using laxatives too much, which can cause diarrhea.  Chronic kidney disease.  Water pills (diuretics).  Eating disorders (bulimia).  Low magnesium level.  Sweating a lot. SIGNS AND SYMPTOMS  Weakness.  Constipation.  Fatigue.  Muscle cramps.  Mental confusion.  Skipped heartbeats or irregular heartbeat (palpitations).  Tingling or numbness. DIAGNOSIS  Your health care provider can diagnose hypokalemia with blood tests. In addition to checking your potassium level, your health care provider may also check other lab tests. TREATMENT Hypokalemia can be treated with potassium supplements taken by mouth or adjustments in your current medicines. If your potassium level is very low, you may need to get potassium through a vein (IV) and be monitored in the hospital. A diet high in potassium is also helpful. Foods high in potassium are:  Nuts, such as peanuts and pistachios.  Seeds, such as sunflower seeds and pumpkin seeds.  Peas, lentils, and lima beans.  Whole grain and bran cereals and  breads.  Fresh fruit and vegetables, such as apricots, avocado, bananas, cantaloupe, kiwi, oranges, tomatoes, asparagus, and potatoes.  Orange and tomato juices.  Red meats.  Fruit yogurt. HOME CARE INSTRUCTIONS  Take all medicines as prescribed by your health care provider.  Maintain a healthy diet by including nutritious food, such as fruits, vegetables, nuts, whole grains, and lean meats.  If you are taking a laxative, be sure to follow the directions on the label. SEEK MEDICAL CARE IF:  Your weakness gets worse.  You feel your heart pounding or racing.  You are vomiting or having diarrhea.  You are diabetic and having trouble keeping your blood glucose in the normal range. SEEK IMMEDIATE MEDICAL CARE IF:  You have chest pain, shortness of breath, or dizziness.  You are vomiting or having diarrhea for more than 2 days.  You faint. MAKE SURE YOU:   Understand these instructions.  Will watch your condition.  Will get help right away if you are not doing well or get worse.   This information is not intended to replace advice given to you by your health care provider. Make sure you discuss any questions you have with your health care provider.   Document Released: 07/03/2005 Document Revised: 07/24/2014 Document Reviewed: 01/03/2013 Elsevier Interactive Patient Education 2016 Else[employer] Inc[employer]    Nausea and Vomiting Nausea is a sick feeling that often comes before throwing up (vomiting). Vomiting is a reflex where stomach contents come out of your mouth. Vomiting can cause severe loss of body fluids (dehydration). Children and elderly adults can become dehydrated quickly, especially if they also have diarrhea. Nausea and vomiting are symptoms of a  condition or disease. It is important to find the cause of your symptoms. CAUSES   Direct irritation of the stomach lining. This irritation can result from increased acid production (gastroesophageal reflux disease),  infection, food poisoning, taking certain medicines (such as nonsteroidal anti-inflammatory drugs), alcohol use, or tobacco use.  Signals from the brain.These signals could be caused by a headache, heat exposure, an inner ear disturbance, increased pressure in the brain from injury, infection, a tumor, or a concussion, pain, emotional stimulus, or metabolic problems.  An obstruction in the gastrointestinal tract (bowel obstruction).  Illnesses such as diabetes, hepatitis, gallbladder problems, appendicitis, kidney problems, cancer, sepsis, atypical symptoms of a heart attack, or eating disorders.  Medical treatments such as chemotherapy and radiation.  Receiving medicine that makes you sleep (general anesthetic) during surgery. DIAGNOSIS Your caregiver may ask for tests to be done if the problems do not improve after a few days. Tests may also be done if symptoms are severe or if the reason for the nausea and vomiting is not clear. Tests may include:  Urine tests.  Blood tests.  Stool tests.  Cultures (to look for evidence of infection).  X-rays or other imaging studies. Test results can help your caregiver make decisions about treatment or the need for additional tests. TREATMENT You need to stay well hydrated. Drink frequently but in small amounts.You may wish to drink water, sports drinks, clear broth, or eat frozen ice pops or gelatin dessert to help stay hydrated.When you eat, eating slowly may help prevent nausea.There are also some antinausea medicines that may help prevent nausea. HOME CARE INSTRUCTIONS   Take all medicine as directed by your caregiver.  If you do not have an appetite, do not force yourself to eat. However, you must continue to drink fluids.  If you have an appetite, eat a normal diet unless your caregiver tells you differently.  Eat a variety of complex carbohydrates (rice, wheat, potatoes, bread), lean meats, yogurt, fruits, and vegetables.  Avoid  high-fat foods because they are more difficult to digest.  Drink enough water and fluids to keep your urine clear or pale yellow.  If you are dehydrated, ask your caregiver for specific rehydration instructions. Signs of dehydration may include:  Severe thirst.  Dry lips and mouth.  Dizziness.  Dark urine.  Decreasing urine frequency and amount.  Confusion.  Rapid breathing or pulse. SEEK IMMEDIATE MEDICAL CARE IF:   You have blood or brown flecks (like coffee grounds) in your vomit.  You have black or bloody stools.  You have a severe headache or stiff neck.  You are confused.  You have severe abdominal pain.  You have chest pain or trouble breathing.  You do not urinate at least once every 8 hours.  You develop cold or clammy skin.  You continue to vomit for longer than 24 to 48 hours.  You have a fever. MAKE SURE YOU:   Understand these instructions.  Will watch your condition.  Will get help right away if you are not doing well or get worse.   This information is not intended to replace advice given to you by your health care provider. Make sure you discuss any questions you have with your health care provider.   Document Released: 07/03/2005 Document Revised: 09/25/2011 Document Reviewed: 11/30/2010 Elsevier Interactive Patient Education [employer]

## 2015-12-19 LAB — CULTURE, GROUP A STREP (THRC)

## 2015-12-20 ENCOUNTER — Telehealth (HOSPITAL_BASED_OUTPATIENT_CLINIC_OR_DEPARTMENT_OTHER): Payer: Self-pay | Admitting: Emergency Medicine

## 2015-12-20 NOTE — Telephone Encounter (Signed)
Post ED Visit - Positive Culture Follow-up  Culture report reviewed by antimicrobial stewardship pharmacist:  []  Enzo BiNathan Batchelder, Pharm.D. []  Celedonio MiyamotoJeremy Frens, Pharm.D., BCPS []  Garvin FilaMike Maccia, Pharm.D. []  Georgina PillionElizabeth Martin, Pharm.D., BCPS []  FisherMinh Pham, [address]Ph[address]D., BCPS, AAHIVP []  Estella HuskMichelle Turner, Pharm.D., BCPS, AAHIVP [x]  Cassie Stewart, [address]Ph[address] []  Sherle Poeob Vincent, [address]  Positive strep culture Treated with Penicillin IM x 1, organism sensitive to the same and no further patient follow-up is required at this time.  Berle MullMiller, Adalis Gatti 12/20/2015, 10:54 AM

## 2015-12-20 NOTE — ED Notes (Signed)
Pt called requesting a work note to take her out of work until Wednesday.  Chart reviewed by Dr. Estell HarpinZammit, note written.  Instructed pt to follow up with pcp or the Er.

## 2015-12-21 LAB — URINE CULTURE

## 2018-02-18 ENCOUNTER — Other Ambulatory Visit: Payer: BLUE CROSS/BLUE SHIELD | Admitting: Adult Health

## 2018-04-01 ENCOUNTER — Encounter: Payer: Self-pay | Admitting: Adult Health

## 2018-04-01 ENCOUNTER — Ambulatory Visit (INDEPENDENT_AMBULATORY_CARE_PROVIDER_SITE_OTHER): Payer: BLUE CROSS/BLUE SHIELD | Admitting: Adult Health

## 2018-04-01 VITALS — BP 112/79 | HR 100 | Ht 66.0 in | Wt 158.0 lb

## 2018-04-01 DIAGNOSIS — Z113 Encounter for screening for infections with a predominantly sexual mode of transmission: Secondary | ICD-10-CM

## 2018-04-01 DIAGNOSIS — Z01419 Encounter for gynecological examination (general) (routine) without abnormal findings: Secondary | ICD-10-CM | POA: Diagnosis not present

## 2018-04-01 DIAGNOSIS — Z975 Presence of (intrauterine) contraceptive device: Secondary | ICD-10-CM | POA: Insufficient documentation

## 2018-04-01 NOTE — Progress Notes (Signed)
Patient ID: [name] CanavanMyra A Wood, female   DOB: Jul 23, 1990, 27 y.o.   MRN: [id number] History of Present Illness: [name] SalkMyra is a 27 year old black female in for a well woman gyn exam,she had a normal pap 11/19/15, but requests STD testing and needs to have nexplanon replaced, it should have been removed last year.   Current Medications, Allergies, Past Medical History, Past Surgical History, Family History and Social History were [name].     Review of Systems: Patient denies any headaches, hearing loss, fatigue, blurred vision, shortness of breath, chest pain, abdominal pain, problems with bowel movements, urination, or intercourse. No joint pain or Wood swings.    Physical Exam:BP 112/79 (BP Location: Left Arm, Patient Position: Sitting, Cuff Size: Normal)   Pulse 100   Ht 5\' 6"  (1.676 m)   Wt 158 lb (71.7 kg)   LMP 03/05/2018   BMI 25.50 kg/m  General:  Well developed, well nourished, no acute distress Skin:  Warm and dry Neck:  Midline trachea, normal thyroid, good ROM, no lymphadenopathy Lungs; Clear to auscultation bilaterally Breast:  No dominant palpable mass, retraction, or nipple discharge Cardiovascular: Regular rate and rhythm Abdomen:  Soft, non tender, no hepatosplenomegaly Pelvic:  External genitalia is normal in appearance, no lesions.  The vagina is normal in appearance. Urethra has no lesions or masses. The cervix is bulbous.  Uterus is felt to be normal size, shape, and contour.  No adnexal masses or tenderness noted.Bladder is non tender, no masses felt.GC/CHL obtained. Extremities/musculoskeletal:  No swelling or varicosities noted, no clubbing or cyanosis,nexplanon in left arm, but past due to be removed, and she wants another one. Psych:  No Wood changes, alert and cooperative,seems happy PHQ 9 score 0. Examination chaperoned by [name] MoodJanet Young LPN.  Impression: 1. Encounter for well woman exam with routine gynecological exam   2.  Screening examination for STD (sexually transmitted disease)   3. Nexplanon in place       Plan: GC/CHL sent Check HIV and RPR No sex for 2 weeks [facility] in [facility] and nexplanon removal and reinsertion in pm,  Will have insurance checked Pap and physical in 1 year

## 2018-04-02 LAB — HIV ANTIBODY (ROUTINE TESTING W REFLEX): HIV Screen 4th Generation wRfx: NONREACTIVE

## 2018-04-02 LAB — RPR: RPR Ser Ql: NONREACTIVE

## 2018-04-03 ENCOUNTER — Telehealth: Payer: Self-pay | Admitting: Adult Health

## 2018-04-03 ENCOUNTER — Encounter: Payer: Self-pay | Admitting: Adult Health

## 2018-04-03 DIAGNOSIS — A749 Chlamydial infection, unspecified: Secondary | ICD-10-CM | POA: Insufficient documentation

## 2018-04-03 HISTORY — DX: Chlamydial infection, unspecified: A74.9

## 2018-04-03 LAB — GC/CHLAMYDIA PROBE AMP
Chlamydia trachomatis, NAA: POSITIVE — AB
Neisseria gonorrhoeae by PCR: NEGATIVE

## 2018-04-03 MED ORDER — AZITHROMYCIN 500 MG PO TABS: ORAL_TABLET | ORAL | 0 refills | Status: DC

## 2018-04-03 NOTE — Telephone Encounter (Signed)
Pt aware,+CHL, -GC,-HIV and -RPR, needs to be treated for chlamydia.Rx Azithromycin 500 mg #2 take 2 po now([facility]) and for POC 10/21 at 11 am,  no sex til after POC.I will treat partner, Cyndie ChimeRonnie Richardson DO [birth year] with azithromycin 500 mg #2 2 [facility].  [facility] sent.

## 2018-04-15 ENCOUNTER — Other Ambulatory Visit: Payer: Self-pay

## 2018-04-15 ENCOUNTER — Encounter: Payer: Self-pay | Admitting: Adult Health

## 2018-04-15 ENCOUNTER — Ambulatory Visit (INDEPENDENT_AMBULATORY_CARE_PROVIDER_SITE_OTHER): Payer: BLUE CROSS/BLUE SHIELD | Admitting: Adult Health

## 2018-04-15 ENCOUNTER — Other Ambulatory Visit: Payer: BLUE CROSS/BLUE SHIELD

## 2018-04-15 VITALS — BP 112/73 | HR 79 | Ht 65.0 in | Wt 155.0 lb

## 2018-04-15 DIAGNOSIS — Z3046 Encounter for surveillance of implantable subdermal contraceptive: Secondary | ICD-10-CM | POA: Diagnosis not present

## 2018-04-15 DIAGNOSIS — Z3202 Encounter for pregnancy test, result negative: Secondary | ICD-10-CM

## 2018-04-15 DIAGNOSIS — Z30017 Encounter for initial prescription of implantable subdermal contraceptive: Secondary | ICD-10-CM

## 2018-04-15 DIAGNOSIS — Z3049 Encounter for surveillance of other contraceptives: Secondary | ICD-10-CM | POA: Diagnosis not present

## 2018-04-15 LAB — BETA HCG QUANT (REF LAB): hCG Quant: <1 m[IU]/mL

## 2018-04-15 LAB — POCT URINE PREGNANCY: Preg Test, Ur: NEGATIVE

## 2018-04-15 MED ORDER — ETONOGESTREL 68 MG ~~LOC~~ IMPL
68.0000 mg | DRUG_IMPLANT | Freq: Once | SUBCUTANEOUS | Status: AC
  Administered 2018-04-15: 68 mg via SUBCUTANEOUS

## 2018-04-15 NOTE — Progress Notes (Signed)
  Subjective:     Patient ID: [name] Wood, female   DOB: 17-Feb-1991, 27 y.o.   MRN: [id number]  HPI [name] Wood is a 27 year old black female in for nexplanon removal and reinsertion.   Review of Systems For nexplanon removal and reinsertion Reviewed past medical,surgical, social and family history. Reviewed medications and allergies.     Objective:   Physical Exam BP 112/73 (BP Location: Right Arm, Patient Position: Sitting, Cuff Size: Normal)   Pulse 79   Ht 5\' 5"  (1.651 m)   Wt 155 lb (70.3 kg)   LMP 04/04/2018   BMI 25.79 kg/m UPT is negative, QHCG is <1. Consent signed, time out called, Left arm cleansed with betadine, and injected with 1.5 cc 1% lidocaine and waited til numb.Under sterile technique a #11 blade was used to make small vertical incision, and a curved forceps was used to easily remove rod. Then new rod inserted and palpated by provider and pt.Steri strips applied and then pressure dressing.    Assessment:     1. Encounter for Nexplanon removal   2. Nexplanon insertion   3. Pregnancy examination or test, negative result   LOT # [id number] exp 05/29/2020     Plan:     Use condoms x 2 weeks, keep clean and dry x 24 hours, no heavy lifting, keep steri strips on x 72 hours, Keep pressure dressing on x 24 hours. Follow up prn problems.   Removal in 3 years

## 2018-04-15 NOTE — Patient Instructions (Signed)
Use condoms x 2 weeks, keep clean and dry x 24 hours, no heavy lifting, keep steri strips on x 72 hours, Keep pressure dressing on x 24 hours. Follow up prn problems.  

## 2018-05-01 ENCOUNTER — Ambulatory Visit: Payer: BLUE CROSS/BLUE SHIELD | Admitting: Adult Health

## 2018-05-06 ENCOUNTER — Encounter: Payer: Self-pay | Admitting: *Deleted

## 2018-05-06 ENCOUNTER — Ambulatory Visit: Payer: BLUE CROSS/BLUE SHIELD | Admitting: Adult Health

## 2018-12-16 ENCOUNTER — Ambulatory Visit: Payer: BLUE CROSS/BLUE SHIELD | Admitting: Obstetrics and Gynecology

## 2018-12-16 ENCOUNTER — Other Ambulatory Visit (HOSPITAL_COMMUNITY)
Admission: RE | Admit: 2018-12-16 | Discharge: 2018-12-16 | Disposition: A | Discharge status: Home / Self Care | Payer: BLUE CROSS/BLUE SHIELD | Source: Ambulatory Visit | Attending: Adult Health | Admitting: Adult Health

## 2018-12-16 ENCOUNTER — Ambulatory Visit: Payer: Self-pay | Admitting: Adult Health

## 2018-12-16 ENCOUNTER — Encounter: Payer: Self-pay | Admitting: Obstetrics and Gynecology

## 2018-12-16 ENCOUNTER — Other Ambulatory Visit: Payer: Self-pay

## 2018-12-16 VITALS — BP 108/61 | HR 97 | Temp 98.1°F | Ht 66.0 in | Wt 158.4 lb

## 2018-12-16 DIAGNOSIS — R319 Hematuria, unspecified: Secondary | ICD-10-CM

## 2018-12-16 DIAGNOSIS — Z01419 Encounter for gynecological examination (general) (routine) without abnormal findings: Secondary | ICD-10-CM | POA: Insufficient documentation

## 2018-12-16 DIAGNOSIS — Z113 Encounter for screening for infections with a predominantly sexual mode of transmission: Secondary | ICD-10-CM

## 2018-12-16 DIAGNOSIS — N898 Other specified noninflammatory disorders of vagina: Secondary | ICD-10-CM | POA: Diagnosis not present

## 2018-12-16 DIAGNOSIS — R300 Dysuria: Secondary | ICD-10-CM | POA: Insufficient documentation

## 2018-12-16 DIAGNOSIS — R350 Frequency of micturition: Secondary | ICD-10-CM

## 2018-12-16 LAB — POCT URINALYSIS DIPSTICK
Glucose, UA: NEGATIVE
Ketones, UA: NEGATIVE
Nitrite, UA: NEGATIVE
Protein, UA: NEGATIVE

## 2018-12-16 MED ORDER — CEPHALEXIN 500 MG PO CAPS: 500.0000 mg | ORAL_CAPSULE | Freq: Four times a day (QID) | ORAL | 0 refills | Status: AC

## 2018-12-16 NOTE — Assessment & Plan Note (Addendum)
Patient with dysuria, frequency and urgency. UA with 2+ leuks -urine culture pending -patient started on Keflex 500mg  QID for 7 days -return precautions discussed

## 2018-12-16 NOTE — Patient Instructions (Signed)
Preventive Care 18-39 Years, Female Preventive care refers to lifestyle choices and visits with your health care provider that can promote health and wellness. What does preventive care include?   A yearly physical exam. This is also called an annual well check.  Dental exams once or twice a year.  Routine eye exams. Ask your health care provider how often you should have your eyes checked.  Personal lifestyle choices, including: ? Daily care of your teeth and gums. ? Regular physical activity. ? Eating a healthy diet. ? Avoiding tobacco and drug use. ? Limiting alcohol use. ? Practicing safe sex. ? Taking vitamin and mineral supplements as recommended by your health care provider. What happens during an annual well check? The services and screenings done by your health care provider during your annual well check will depend on your age, overall health, lifestyle risk factors, and family history of disease. Counseling Your health care provider may ask you questions about your:  Alcohol use.  Tobacco use.  Drug use.  Emotional well-being.  Home and relationship well-being.  Sexual activity.  Eating habits.  Work and work environment.  Method of birth control.  Menstrual cycle.  Pregnancy history. Screening You may have the following tests or measurements:  Height, weight, and BMI.  Diabetes screening. This is done by checking your blood sugar (glucose) after you have not eaten for a while (fasting).  Blood pressure.  Lipid and cholesterol levels. These may be checked every 5 years starting at age 20.  Skin check.  Hepatitis C blood test.  Hepatitis B blood test.  Sexually transmitted disease (STD) testing.  BRCA-related cancer screening. This may be done if you have a family history of breast, ovarian, tubal, or peritoneal cancers.  Pelvic exam and Pap test. This may be done every 3 years starting at age 21. Starting at age 30, this may be done every 5  years if you have a Pap test in combination with an HPV test. Discuss your test results, treatment options, and if necessary, the need for more tests with your health care provider. Vaccines Your health care provider may recommend certain vaccines, such as:  Influenza vaccine. This is recommended every year.  Tetanus, diphtheria, and acellular pertussis (Tdap, Td) vaccine. You may need a Td booster every 10 years.  Varicella vaccine. You may need this if you have not been vaccinated.  HPV vaccine. If you are 26 or younger, you may need three doses over 6 months.  Measles, mumps, and rubella (MMR) vaccine. You may need at least one dose of MMR. You may also need a second dose.  Pneumococcal 13-valent conjugate (PCV13) vaccine. You may need this if you have certain conditions and were not previously vaccinated.  Pneumococcal polysaccharide (PPSV23) vaccine. You may need one or two doses if you smoke cigarettes or if you have certain conditions.  Meningococcal vaccine. One dose is recommended if you are age 19-21 years and a first-year college student living in a residence hall, or if you have one of several medical conditions. You may also need additional booster doses.  Hepatitis A vaccine. You may need this if you have certain conditions or if you travel or work in places where you may be exposed to hepatitis A.  Hepatitis B vaccine. You may need this if you have certain conditions or if you travel or work in places where you may be exposed to hepatitis B.  Haemophilus influenzae type b (Hib) vaccine. You may need this if you   have certain risk factors. Talk to your health care provider about which screenings and vaccines you need and how often you need them. This information is not intended to replace advice given to you by your health care provider. Make sure you discuss any questions you have with your health care provider. Document Released: 08/29/2001 Document Revised: 02/13/2017  Document Reviewed: 05/04/2015 Elsevier Interactive Patient Education  2019 [employer].

## 2018-12-16 NOTE — Progress Notes (Signed)
  Subjective:  Patient ID: [name] Wood  DOB: Aug 02, 1990 MRN: [id number]  [name] Wood is a 27 y.o. female here for STI check, Dysuria and Pap smear  HPI:  Dysuria: - Patient reports that for the past week she has had increased frequency and burning when she pees. She states that she has previously had a UTI when pregnant and was having similar burning and frequency but this feels slightly different as now she is having urgency. - denies any fevers, chills  Health maintenance: -patient here for STI screen and is due for pap smear, will obtain pap- normal pap 11/19/15 - STI screening- patient reports that she is not having any abnormal vaginal discharge, just here for routine STI screening- denies any abdominal pain  ROS: All other systems otherwise negative, except as mentioned in HPI  Patient Active Problem List   Diagnosis Date Noted  . Dysuria 12/16/2018  . Chlamydia 04/03/2018  . Nexplanon in place 04/01/2018  . Screening examination for STD (sexually transmitted disease) 04/01/2018  . Encounter for well woman exam with routine gynecological exam 04/01/2018  . Insertion of implantable subdermal contraceptive 01/28/2014  . Postpartum care following vaginal delivery 01/08/2014  . Marijuana smoker 10/15/2013  . Pregnant 10/14/2013     Objective:  BP 108/61 (BP Location: Right Arm, Patient Position: Sitting, Cuff Size: Normal)   Pulse 97   Temp 98.1 F (36.7 C)   Ht 5\' 6"  (1.676 m)   Wt 158 lb 6.4 oz (71.8 kg)   LMP 11/20/2018 (Approximate)   BMI 25.57 kg/m   Vitals and nursing note reviewed  General: NAD, pleasant Pulm: normal effort GU/GYN: External genitalia within normal limits.  Vaginal mucosa pink, moist, normal rugae.  Nonfriable cervix without lesions, some white thin discharge noted, no bleeding noted on speculum exam.  Exam performed in the presence of a chaperone. Extremities: no edema or cyanosis. WWP. Skin: warm and dry, no rashes noted Neuro: alert and  oriented, no focal deficits Psych: normal affect, normal thought content  Assessment & Plan:   Screening examination for STD (sexually transmitted disease) STI screening performed today. RPR, HIV declined.   Encounter for well woman exam with routine gynecological exam Pap performed today. Normal pap 11/19/15  Dysuria Patient with dysuria, frequency and urgency. UA with 2+ leuks -urine culture pending -patient started on Keflex 500mg  QID for 7 days -return precautions discussed   [place] Yitzhak Awan, DO Family Medicine Resident PGY-2

## 2018-12-16 NOTE — Assessment & Plan Note (Signed)
Pap performed today. Normal pap 11/19/15

## 2018-12-16 NOTE — Assessment & Plan Note (Signed)
STI screening performed today. RPR, HIV declined.

## 2018-12-17 ENCOUNTER — Ambulatory Visit: Payer: BLUE CROSS/BLUE SHIELD | Admitting: Adult Health

## 2018-12-17 LAB — CYTOLOGY - PAP
Diagnosis: NEGATIVE
HPV: NOT DETECTED

## 2018-12-18 ENCOUNTER — Telehealth: Payer: Self-pay | Admitting: Adult Health

## 2018-12-18 ENCOUNTER — Encounter: Payer: Self-pay | Admitting: Adult Health

## 2018-12-18 DIAGNOSIS — A599 Trichomoniasis, unspecified: Secondary | ICD-10-CM

## 2018-12-18 HISTORY — DX: Trichomoniasis, unspecified: A59.9

## 2018-12-18 LAB — URINE CULTURE: Organism ID, Bacteria: NO GROWTH

## 2018-12-18 MED ORDER — METRONIDAZOLE 500 MG PO TABS: 500.0000 mg | ORAL_TABLET | Freq: Two times a day (BID) | ORAL | 0 refills | Status: DC

## 2018-12-18 NOTE — Telephone Encounter (Signed)
No VM, if she calls back,  had trich on pap, flagyl sent to [facility] for her, no sex and POC in 10 days with me, partner needs to be treated too, will need his name, DO,drug store and any allergies

## 2018-12-18 NOTE — Telephone Encounter (Signed)
Pt said partner Cyndie Chime DOB [birth year] rx flagyl 500 mg # 4 4 po to [facility].. she is aware she had rx too and no sex, POC 6/15 at 11 am

## 2018-12-23 LAB — NUSWAB VAGINITIS PLUS (VG+)
Atopobium vaginae: HIGH Score — AB
BVAB 2: HIGH Score — AB
Candida albicans, NAA: NEGATIVE
Candida glabrata, NAA: NEGATIVE
Chlamydia trachomatis, NAA: NEGATIVE
Megasphaera 1: HIGH Score — AB
Neisseria gonorrhoeae, NAA: NEGATIVE
Trich vag by NAA: POSITIVE — AB

## 2018-12-27 ENCOUNTER — Encounter: Payer: Self-pay | Admitting: *Deleted

## 2018-12-30 ENCOUNTER — Ambulatory Visit (INDEPENDENT_AMBULATORY_CARE_PROVIDER_SITE_OTHER): Payer: BLUE CROSS/BLUE SHIELD | Admitting: Adult Health

## 2018-12-30 ENCOUNTER — Other Ambulatory Visit: Payer: Self-pay

## 2018-12-30 ENCOUNTER — Encounter: Payer: Self-pay | Admitting: Adult Health

## 2018-12-30 VITALS — BP 119/80 | HR 118 | Ht 66.0 in | Wt 160.0 lb

## 2018-12-30 DIAGNOSIS — N898 Other specified noninflammatory disorders of vagina: Secondary | ICD-10-CM | POA: Insufficient documentation

## 2018-12-30 DIAGNOSIS — Z8619 Personal history of other infectious and parasitic diseases: Secondary | ICD-10-CM

## 2018-12-30 MED ORDER — FLUCONAZOLE 150 MG PO TABS: ORAL_TABLET | ORAL | 1 refills | Status: DC

## 2018-12-30 MED ORDER — NYSTATIN-TRIAMCINOLONE 100000-0.1 UNIT/GM-% EX CREA: 1.0000 "application " | TOPICAL_CREAM | Freq: Two times a day (BID) | CUTANEOUS | 0 refills | Status: DC

## 2018-12-30 NOTE — Progress Notes (Signed)
Patient ID: [name] Wood, female   DOB: Nov 06, 1990, 28 y.o.   MRN: [id number] History of Present Illness: [name] Wood is a 28 year old black female, G2P2 in for proof of cure for recent +Trich on pap and Nuswab and BV on nuswab, now has clumpy discharge and irritation and has used 3 day OTC monistatm still irritated.   Current Medications, Allergies, Past Medical History, Past Surgical History, Family History and Social History were reviewed in [employer] record.     Review of Systems: +white clumpy discharge  +vaginal irritation and itch No sex since meds for trich    Physical Exam:BP 119/80 (BP Location: Left Arm, Patient Position: Sitting, Cuff Size: Normal)   Pulse (!) 118   Ht 5\' 6"  (1.676 m)   Wt 160 lb (72.6 kg)   BMI 25.82 kg/m  General:  Well developed, well nourished, no acute distress Skin:  Warm and dry Pelvic:  External genitalia is normal in appearance, no lesions.  The vagina is normal in appearance, white cream from [place] night. Urethra has no lesions or masses. The cervix is bulbous.  Uterus is felt to be normal size, shape, and contour.  No adnexal masses or tenderness noted.Bladder is non tender, no masses felt. Nuswab obtained.  Psych:  No mood changes, alert and cooperative,seems happy Fall risk is low. Examination chaperoned by Levy Pupa LPN.  Impression:  1. History of trichomoniasis   2. Vaginal irritation   3. Vaginal discharge      Plan: Nuswab sent Meds ordered this encounter  Medications  . fluconazole (DIFLUCAN) 150 MG tablet    Sig: Take 1 now and repeat 1 in 3 days if needed    Dispense:  2 tablet    Refill:  1    Order Specific Question:   Supervising Provider    Answer:   Elonda Husky, LUTHER H [[id number]]  . nystatin-triamcinolone (MYCOLOG II) cream    Sig: Apply 1 application topically 2 (two) times daily.    Dispense:  30 g    Refill:  0    Order Specific Question:   Supervising Provider    Answer:   Tania Ade H  [[id number]]  Follow up prn

## 2019-01-02 LAB — NUSWAB VAGINITIS PLUS (VG+)
Candida albicans, NAA: POSITIVE — AB
Candida glabrata, NAA: NEGATIVE
Chlamydia trachomatis, NAA: NEGATIVE
Neisseria gonorrhoeae, NAA: NEGATIVE
Trich vag by NAA: NEGATIVE

## 2019-04-02 ENCOUNTER — Other Ambulatory Visit: Payer: BLUE CROSS/BLUE SHIELD | Admitting: Adult Health

## 2019-06-25 DIAGNOSIS — Z6827 Body mass index (BMI) 27.0-27.9, adult: Secondary | ICD-10-CM | POA: Diagnosis not present

## 2019-06-25 DIAGNOSIS — Z113 Encounter for screening for infections with a predominantly sexual mode of transmission: Secondary | ICD-10-CM | POA: Diagnosis not present

## 2020-03-09 ENCOUNTER — Encounter (HOSPITAL_COMMUNITY): Payer: Self-pay

## 2020-03-09 ENCOUNTER — Other Ambulatory Visit: Payer: Self-pay

## 2020-03-09 ENCOUNTER — Emergency Department (HOSPITAL_COMMUNITY)
Admission: EM | Admit: 2020-03-09 | Discharge: 2020-03-09 | Disposition: A | Discharge status: Home / Self Care | Payer: BC Managed Care – PPO | Attending: Emergency Medicine | Admitting: Emergency Medicine

## 2020-03-09 DIAGNOSIS — R0981 Nasal congestion: Secondary | ICD-10-CM | POA: Diagnosis not present

## 2020-03-09 DIAGNOSIS — Z20822 Contact with and (suspected) exposure to covid-19: Secondary | ICD-10-CM | POA: Diagnosis not present

## 2020-03-09 DIAGNOSIS — R0602 Shortness of breath: Secondary | ICD-10-CM | POA: Insufficient documentation

## 2020-03-09 DIAGNOSIS — Z5321 Procedure and treatment not carried out due to patient leaving prior to being seen by health care provider: Secondary | ICD-10-CM | POA: Insufficient documentation

## 2020-03-09 LAB — SARS CORONAVIRUS 2 BY RT PCR (HOSPITAL ORDER, PERFORMED IN ~~LOC~~ HOSPITAL LAB): SARS Coronavirus 2: NEGATIVE

## 2020-03-09 NOTE — ED Triage Notes (Signed)
Pt reports hurts to take a deep breath.  Reports feels like has congestion in her chest and she tries to cough it up.  Reports symptoms started around 11pm.

## 2020-05-11 ENCOUNTER — Telehealth: Payer: Self-pay | Admitting: Adult Health

## 2020-05-11 ENCOUNTER — Telehealth: Payer: Self-pay | Admitting: *Deleted

## 2020-05-11 MED ORDER — MEGESTROL ACETATE 40 MG PO TABS: ORAL_TABLET | ORAL | 1 refills | Status: DC

## 2020-05-11 NOTE — Telephone Encounter (Signed)
Patient states that she is having longer than normal periods while on her birth control wants to know if there is something else that she can take to help stop the bleeding

## 2020-05-11 NOTE — Telephone Encounter (Signed)
Patient states over the last 3 months she is having longer than normal periods with her Nexplanon and wating to know if there is something that she can take to help stop the bleeding. Advised to check back with pharmacy later today if she does not hear back from our office.

## 2020-05-11 NOTE — Telephone Encounter (Signed)
Will rx megace 

## 2020-05-11 NOTE — Addendum Note (Signed)
Addended by: Cyril Mourning A on: 05/11/2020 04:58 PM   Modules accepted: Orders

## 2020-11-30 ENCOUNTER — Ambulatory Visit
Admission: RE | Admit: 2020-11-30 | Discharge: 2020-11-30 | Disposition: A | Discharge status: Home / Self Care | Payer: Self-pay | Source: Ambulatory Visit | Attending: Family Medicine | Admitting: Family Medicine

## 2020-11-30 ENCOUNTER — Other Ambulatory Visit: Payer: Self-pay

## 2020-11-30 ENCOUNTER — Telehealth: Payer: Self-pay

## 2020-11-30 VITALS — BP 117/78 | HR 90 | Temp 98.4°F | Resp 18

## 2020-11-30 DIAGNOSIS — B9629 Other Escherichia coli [E. coli] as the cause of diseases classified elsewhere: Secondary | ICD-10-CM

## 2020-11-30 DIAGNOSIS — R350 Frequency of micturition: Secondary | ICD-10-CM | POA: Insufficient documentation

## 2020-11-30 DIAGNOSIS — N3001 Acute cystitis with hematuria: Secondary | ICD-10-CM | POA: Insufficient documentation

## 2020-11-30 DIAGNOSIS — R300 Dysuria: Secondary | ICD-10-CM | POA: Insufficient documentation

## 2020-11-30 LAB — POCT URINALYSIS DIP (MANUAL ENTRY)
Bilirubin, UA: NEGATIVE
Glucose, UA: NEGATIVE mg/dL
Ketones, POC UA: NEGATIVE mg/dL
Nitrite, UA: NEGATIVE
Protein Ur, POC: 30 mg/dL — AB
Spec Grav, UA: 1.015 (ref 1.010–1.025)
Urobilinogen, UA: 0.2 E.U./dL
pH, UA: 7 (ref 5.0–8.0)

## 2020-11-30 MED ORDER — FLUCONAZOLE 150 MG PO TABS: ORAL_TABLET | ORAL | 0 refills | Status: DC

## 2020-11-30 MED ORDER — CEPHALEXIN 500 MG PO CAPS: 500.0000 mg | ORAL_CAPSULE | Freq: Two times a day (BID) | ORAL | 0 refills | Status: AC

## 2020-11-30 NOTE — Discharge Instructions (Signed)
I have sent in Keflex for you to take twice a day for 7 days  I have sent in fluconazole in case of yeast. Take one tablet at the onset of symptoms. If symptoms are still present in 3 days, take the second tablet.   You may have a urinary tract infection.   We are going to culture your urine and will call you as soon as we have the results.   Drink plenty of water, 8-10 glasses per day.   You may take AZO over the counter for painful urination.  Follow up with your primary care provider as needed.   Go to the Emergency Department if you experience severe pain, shortness of breath, high fever, or other concerns.   

## 2020-11-30 NOTE — Telephone Encounter (Signed)
Patient called she is experiencing symptoms of a UTI but due to her work schedule she is unable to get off or come in before we close she is requesting medication for this ph# [phone]

## 2020-11-30 NOTE — ED Provider Notes (Signed)
[facility]    CSN: [id number] Arrival date & time: 11/30/20  1741      History   Chief Complaint Chief Complaint  Patient presents with  . Urinary Frequency    HPI [name] Wood is a 30 y.o. female.   Reports dysuria, urgency, frequency and hematuria since last night. Has not attempted OTC treatment. Has hx UTIs.  Denies vaginal odor, discharge, dyspareunia, abnormal bleeding, abdominal pain, chills, fever, other symptoms.  ROS per HPI  The history is provided by the patient.    Past Medical History:  Diagnosis Date  . Chlamydia 04/03/2018  . History of chlamydia    Pt and partner was treated.  . Trichimoniasis 12/18/2018   6/3 treated     Patient Active Problem List   Diagnosis Date Noted  . Vaginal irritation 12/30/2018  . History of trichomoniasis 12/30/2018  . Vaginal discharge 12/30/2018  . Trichimoniasis 12/18/2018  . Dysuria 12/16/2018  . Chlamydia 04/03/2018  . Nexplanon in place 04/01/2018  . Screening examination for STD (sexually transmitted disease) 04/01/2018  . Encounter for well woman exam with routine gynecological exam 04/01/2018  . Insertion of implantable subdermal contraceptive 01/28/2014  . Postpartum care following vaginal delivery 01/08/2014  . Marijuana smoker 10/15/2013  . Pregnant 10/14/2013    History reviewed. No pertinent surgical history.  OB History    Gravida  2   Para  2   Term  2   Preterm      AB      Living  2     SAB      IAB      Ectopic      Multiple      Live Births  2            Home Medications    Prior to Admission medications   Medication Sig Start Date End Date Taking? Authorizing Provider  cephALEXin (KEFLEX) 500 MG capsule Take 1 capsule (500 mg total) by mouth 2 (two) times daily for 7 days. 11/30/20 12/07/20 Yes Moshe Cipro, NP  fluconazole (DIFLUCAN) 150 MG tablet Take one tablet at the onset of symptoms. If symptoms are still present 3 days later, take the second  tablet. 11/30/20  Yes Moshe Cipro, NP  etonogestrel (NEXPLANON) 68 MG IMPL implant 1 each by Subdermal route once.    [provider]  megestrol (MEGACE) 40 MG tablet Take 2 daily for now 05/11/20   Adline Potter, NP  nystatin-triamcinolone (MYCOLOG II) cream Apply 1 application topically 2 (two) times daily. 12/30/18   Adline Potter, NP    Family History Family History  Problem Relation Age of Onset  . Lupus Maternal Grandmother     Social History Social History   Tobacco Use  . Smoking status: Current Every Day Smoker  . Smokeless tobacco: Never Used  Substance Use Topics  . Alcohol use: No  . Drug use: No     Allergies   Patient has no known allergies.   Review of Systems Review of Systems   Physical Exam Triage Vital Signs ED Triage Vitals [11/30/20 1845]  Enc Vitals Group     BP 117/78     Pulse Rate 90     Resp 18     Temp 98.4 F (36.9 C)     Temp Source Oral     SpO2 98 %     Weight      Height      Head Circumference  Peak Flow      Pain Score      Pain Loc      Pain Edu?      Excl. in GC?    No data found.  Updated Vital Signs BP 117/78   Pulse 90   Temp 98.4 F (36.9 C) (Oral)   Resp 18   SpO2 98%   Visual Acuity Right Eye Distance:   Left Eye Distance:   Bilateral Distance:    Right Eye Near:   Left Eye Near:    Bilateral Near:     Physical Exam Vitals and nursing note reviewed.  Constitutional:      General: She is not in acute distress.    Appearance: She is well-developed.  HENT:     Head: Normocephalic and atraumatic.     Nose: Nose normal.     Mouth/Throat:     Mouth: Mucous membranes are moist.     Pharynx: Oropharynx is clear.  Eyes:     Extraocular Movements: Extraocular movements intact.     Conjunctiva/sclera: Conjunctivae normal.     Pupils: Pupils are equal, round, and reactive to light.  Cardiovascular:     Rate and Rhythm: Normal rate and regular rhythm.  Pulmonary:      Effort: Pulmonary effort is normal.  Abdominal:     General: There is no distension.     Palpations: Abdomen is soft. There is no mass.     Tenderness: There is no abdominal tenderness. There is no right CVA tenderness, left CVA tenderness, guarding or rebound.     Hernia: No hernia is present.  Musculoskeletal:        General: Normal range of motion.     Cervical back: Normal range of motion and neck supple.  Lymphadenopathy:     Cervical: No cervical adenopathy.  Skin:    General: Skin is warm and dry.     Capillary Refill: Capillary refill takes less than 2 seconds.  Neurological:     General: No focal deficit present.     Mental Status: She is alert and oriented to person, place, and time.  Psychiatric:        Mood and Affect: Mood normal.        Behavior: Behavior normal.        Thought Content: Thought content normal.      UC Treatments / Results  Labs (all labs ordered are listed, but only abnormal results are displayed) Labs Reviewed  URINE CULTURE - Abnormal; Notable for the following components:      Result Value   Culture >=100,000 COLONIES/mL ESCHERICHIA COLI (*)    Organism ID, Bacteria ESCHERICHIA COLI (*)    All other components within normal limits  POCT URINALYSIS DIP (MANUAL ENTRY) - Abnormal; Notable for the following components:   Color, UA light yellow (*)    Clarity, UA cloudy (*)    Blood, UA large (*)    Protein Ur, POC =30 (*)    Leukocytes, UA Large (3+) (*)    All other components within normal limits    EKG   Radiology No results found.  Procedures Procedures (including critical care time)  Medications Ordered in UC Medications - No data to display  Initial Impression / Assessment and Plan / UC Course  I have reviewed the triage vital signs and the nursing notes.  Pertinent labs & imaging results that were available during my care of the patient were reviewed by me and considered in my  medical decision making (see chart for  details).    Acute cystitis with hematuria Dysuria Urinary frequency  UA concerning for infection in the office today Prescribed Keflex 500 mg twice daily x7 days Prescribed fluconazole in case of yeast with Keflex Will culture urine and will be in contact with abnormal results that require further treatment Push fluids Get plenty of rest May take AZO over-the-counter Follow up with this office or with primary care if symptoms are persisting.  Follow up in the ER for high fever, trouble swallowing, trouble breathing, other concerning symptoms.   Final Clinical Impressions(s) / UC Diagnoses   Final diagnoses:  Dysuria  Urinary frequency  Acute cystitis with hematuria     Discharge Instructions     I have sent in Keflex for you to take twice a day for 7 days  I have sent in fluconazole in case of yeast. Take one tablet at the onset of symptoms. If symptoms are still present in 3 days, take the second tablet.   You may have a urinary tract infection.   We are going to culture your urine and will call you as soon as we have the results.   Drink plenty of water, 8-10 glasses per day.   You may take AZO over the counter for painful urination.  Follow up with your primary care provider as needed.   Go to the Emergency Department if you experience severe pain, shortness of breath, high fever, or other concerns.      ED Prescriptions    Medication Sig Dispense Auth. Provider   cephALEXin (KEFLEX) 500 MG capsule Take 1 capsule (500 mg total) by mouth 2 (two) times daily for 7 days. 14 capsule Moshe Cipro, NP   fluconazole (DIFLUCAN) 150 MG tablet Take one tablet at the onset of symptoms. If symptoms are still present 3 days later, take the second tablet. 2 tablet Moshe Cipro, NP     PDMP not reviewed this encounter.   Moshe Cipro, NP 12/07/20 [phone]

## 2020-11-30 NOTE — ED Triage Notes (Signed)
urinary frequency x 2 days

## 2020-12-03 LAB — URINE CULTURE: Culture: >=100000 — AB

## 2021-04-04 ENCOUNTER — Ambulatory Visit
Admission: EM | Admit: 2021-04-04 | Discharge: 2021-04-04 | Disposition: A | Discharge status: Home / Self Care | Payer: Self-pay | Attending: Family Medicine | Admitting: Family Medicine

## 2021-04-04 ENCOUNTER — Encounter: Payer: Self-pay | Admitting: Emergency Medicine

## 2021-04-04 ENCOUNTER — Other Ambulatory Visit: Payer: Self-pay

## 2021-04-04 DIAGNOSIS — N390 Urinary tract infection, site not specified: Secondary | ICD-10-CM | POA: Insufficient documentation

## 2021-04-04 DIAGNOSIS — J020 Streptococcal pharyngitis: Secondary | ICD-10-CM | POA: Insufficient documentation

## 2021-04-04 LAB — POCT URINALYSIS DIP (MANUAL ENTRY)
Bilirubin, UA: NEGATIVE
Glucose, UA: NEGATIVE mg/dL
Nitrite, UA: NEGATIVE
Protein Ur, POC: NEGATIVE mg/dL
Spec Grav, UA: 1.025 (ref 1.010–1.025)
Urobilinogen, UA: 0.2 E.U./dL
pH, UA: 5.5 (ref 5.0–8.0)

## 2021-04-04 LAB — POCT RAPID STREP A (OFFICE): Rapid Strep A Screen: POSITIVE — AB

## 2021-04-04 MED ORDER — LIDOCAINE VISCOUS HCL 2 % MT SOLN: 5.0000 mL | OROMUCOSAL | 0 refills | Status: DC | PRN

## 2021-04-04 MED ORDER — AMOXICILLIN-POT CLAVULANATE 875-125 MG PO TABS: 1.0000 | ORAL_TABLET | Freq: Two times a day (BID) | ORAL | 0 refills | Status: DC

## 2021-04-04 NOTE — ED Triage Notes (Signed)
Sore throat since yesterday. Unable to hold urine.  States it feel funny after she urinates.  Feels week.

## 2021-04-05 NOTE — ED Provider Notes (Signed)
[facility]    CSN: [id number] Arrival date & time: 04/04/21  1452      History   Chief Complaint No chief complaint on file.   HPI [name] Wood is a 30 y.o. female.   Patient presenting today with sudden onset severe sore throat worse on the right side, fever, chills, body aches, weakness, fatigue.  She also started having dysuria, urinary incontinence, urinary frequency this morning.  She has not been tried anything over-the-counter for symptoms thus far.  No known sick contacts recently.  Does have a history of urinary tract infections that have felt similar.   Past Medical History:  Diagnosis Date   Chlamydia 04/03/2018   History of chlamydia    Pt and partner was treated.   Trichimoniasis 12/18/2018   6/3 treated     Patient Active Problem List   Diagnosis Date Noted   Vaginal irritation 12/30/2018   History of trichomoniasis 12/30/2018   Vaginal discharge 12/30/2018   Trichimoniasis 12/18/2018   Dysuria 12/16/2018   Chlamydia 04/03/2018   Nexplanon in place 04/01/2018   Screening examination for STD (sexually transmitted disease) 04/01/2018   Encounter for well woman exam with routine gynecological exam 04/01/2018   Insertion of implantable subdermal contraceptive 01/28/2014   Postpartum care following vaginal delivery 01/08/2014   Marijuana smoker 10/15/2013   Pregnant 10/14/2013    History reviewed. No pertinent surgical history.  OB History     Gravida  2   Para  2   Term  2   Preterm      AB      Living  2      SAB      IAB      Ectopic      Multiple      Live Births  2            Home Medications    Prior to Admission medications   Medication Sig Start Date End Date Taking? Authorizing Provider  amoxicillin-clavulanate (AUGMENTIN) 875-125 MG tablet Take 1 tablet by mouth every 12 (twelve) hours. 04/04/21  Yes Particia Nearing, PA-C  lidocaine (XYLOCAINE) 2 % solution Use as directed 5 mLs in the mouth or  throat as needed for mouth pain. 04/04/21  Yes Particia Nearing, PA-C  etonogestrel (NEXPLANON) 68 MG IMPL implant 1 each by Subdermal route once.    [provider]  fluconazole (DIFLUCAN) 150 MG tablet Take one tablet at the onset of symptoms. If symptoms are still present 3 days later, take the second tablet. 11/30/20   Moshe Cipro, NP  megestrol (MEGACE) 40 MG tablet Take 2 daily for now 05/11/20   Adline Potter, NP  nystatin-triamcinolone (MYCOLOG II) cream Apply 1 application topically 2 (two) times daily. 12/30/18   Adline Potter, NP    Family History Family History  Problem Relation Age of Onset   Lupus Maternal Grandmother     Social History Social History   Tobacco Use   Smoking status: Every Day   Smokeless tobacco: Never  Substance Use Topics   Alcohol use: No   Drug use: No     Allergies   Patient has no known allergies.   Review of Systems Review of Systems Per HPI  Physical Exam Triage Vital Signs ED Triage Vitals  Enc Vitals Group     BP 04/04/21 1705 119/75     Pulse Rate 04/04/21 1705 (!) 129     Resp 04/04/21 1705 16  Temp 04/04/21 1705 (!) 100.5 F (38.1 C)     Temp Source 04/04/21 1705 Temporal     SpO2 04/04/21 1705 97 %     Weight --      Height --      Head Circumference --      Peak Flow --      Pain Score 04/04/21 1706 8     Pain Loc --      Pain Edu? --      Excl. in GC? --    No data found.  Updated Vital Signs BP 119/75 (BP Location: Right Arm)   Pulse (!) 129   Temp (!) 100.5 F (38.1 C) (Temporal)   Resp 16   SpO2 97%   Visual Acuity Right Eye Distance:   Left Eye Distance:   Bilateral Distance:    Right Eye Near:   Left Eye Near:    Bilateral Near:     Physical Exam Vitals and nursing note reviewed.  Constitutional:      Appearance: Normal appearance. She is not ill-appearing.  HENT:     Head: Atraumatic.     Right Ear: Tympanic membrane normal.     Left Ear: Tympanic  membrane normal.     Nose: Nose normal.     Mouth/Throat:     Mouth: Mucous membranes are moist.     Pharynx: Oropharyngeal exudate and posterior oropharyngeal erythema present.     Comments: Moderate tonsillar edema and erythema, worse on the right with copious exudates.  Uvula midline, oral airway patent Eyes:     Extraocular Movements: Extraocular movements intact.     Conjunctiva/sclera: Conjunctivae normal.  Cardiovascular:     Rate and Rhythm: Normal rate and regular rhythm.     Heart sounds: Normal heart sounds.  Pulmonary:     Effort: Pulmonary effort is normal.     Breath sounds: Normal breath sounds.  Abdominal:     General: Bowel sounds are normal. There is no distension.     Palpations: Abdomen is soft.     Tenderness: There is abdominal tenderness. There is no right CVA tenderness, left CVA tenderness or guarding.     Comments: Suprapubic abdominal tenderness to palpation  Musculoskeletal:        General: Normal range of motion.     Cervical back: Normal range of motion and neck supple.  Skin:    General: Skin is warm and dry.  Neurological:     Mental Status: She is alert and oriented to person, place, and time.  Psychiatric:        Mood and Affect: Mood normal.        Thought Content: Thought content normal.        Judgment: Judgment normal.     UC Treatments / Results  Labs (all labs ordered are listed, but only abnormal results are displayed) Labs Reviewed  POCT RAPID STREP A (OFFICE) - Abnormal; Notable for the following components:      Result Value   Rapid Strep A Screen Positive (*)    All other components within normal limits  POCT URINALYSIS DIP (MANUAL ENTRY) - Abnormal; Notable for the following components:   Ketones, POC UA trace (5) (*)    Blood, UA trace-intact (*)    Leukocytes, UA Small (1+) (*)    All other components within normal limits  URINE CULTURE    EKG   Radiology No results found.  Procedures Procedures (including  critical care time)  Medications  Ordered in UC Medications - No data to display  Initial Impression / Assessment and Plan / UC Course  I have reviewed the triage vital signs and the nursing notes.  Pertinent labs & imaging results that were available during my care of the patient were reviewed by me and considered in my medical decision making (see chart for details).     Febrile and tachycardic in triage, rapid strep positive and urinalysis showing a urinary tract infection.  Urine culture pending, will start Augmentin and viscous lidocaine and discussed supportive over-the-counter medications and home care.  We will adjust if needed based on urine culture results.  Follow-up if worsening symptoms.  Work note given.  Final Clinical Impressions(s) / UC Diagnoses   Final diagnoses:  Acute lower UTI  Strep pharyngitis   Discharge Instructions   None    ED Prescriptions     Medication Sig Dispense Auth. Provider   amoxicillin-clavulanate (AUGMENTIN) 875-125 MG tablet Take 1 tablet by mouth every 12 (twelve) hours. 20 tablet Particia Nearing, [place]   lidocaine (XYLOCAINE) 2 % solution Use as directed 5 mLs in the mouth or throat as needed for mouth pain. 50 mL Particia Nearing, [place]      PDMP not reviewed this encounter.   Roosvelt Maser [place], [place] 04/05/21 [phone]

## 2021-04-06 LAB — URINE CULTURE: Culture: >=100000 — AB

## 2022-04-24 ENCOUNTER — Ambulatory Visit (INDEPENDENT_AMBULATORY_CARE_PROVIDER_SITE_OTHER): Payer: BC Managed Care – PPO | Admitting: Obstetrics and Gynecology

## 2022-04-24 ENCOUNTER — Encounter: Payer: Self-pay | Admitting: Obstetrics and Gynecology

## 2022-04-24 VITALS — BP 115/78 | HR 99 | Ht 65.0 in | Wt 157.1 lb

## 2022-04-24 DIAGNOSIS — Z975 Presence of (intrauterine) contraceptive device: Secondary | ICD-10-CM

## 2022-04-24 DIAGNOSIS — Z3046 Encounter for surveillance of implantable subdermal contraceptive: Secondary | ICD-10-CM | POA: Diagnosis not present

## 2022-04-24 MED ORDER — MEGESTROL ACETATE 40 MG PO TABS: ORAL_TABLET | ORAL | 1 refills | Status: DC

## 2022-04-24 MED ORDER — ETONOGESTREL 68 MG ~~LOC~~ IMPL
68.0000 mg | DRUG_IMPLANT | Freq: Once | SUBCUTANEOUS | Status: AC
  Administered 2022-04-24: 68 mg via SUBCUTANEOUS

## 2022-04-24 NOTE — Addendum Note (Signed)
Addended by: Jesusita Oka on: 04/24/2022 12:05 PM   Modules accepted: Orders

## 2022-04-24 NOTE — Progress Notes (Signed)
     GYNECOLOGY OFFICE PROCEDURE NOTE  [name] Wood is a 31 y.o. [id number] here for Nexplanon removal and insertion.  Last pap smear was on 12/2018 and was normal.  No other gynecologic concerns.  Nexplanon Removal and Reinsertion Patient identified, informed consent performed, consent signed.   Patient does understand that irregular bleeding is a very common side effect of this medication. She was advised to have backup contraception for one week after replacement of the implant.   Pregnancy test in clinic today was negative.  Appropriate time out taken. Nexplanon site identified in left arm.  Area prepped in usual sterile fashon. One ml of 1% lidocaine was used to anesthetize the area at the distal end of the implant. A small stab incision was made right beside the implant on the distal portion. The Nexplanon rod was grasped using hemostats and removed without difficulty. There was minimal blood loss. There were no complications. Area was then injected with 3 ml of 1 % lidocaine. She was re-prepped with betadine, Nexplanon removed from packaging, Device confirmed in needle, then inserted full length of needle and withdrawn per handbook instructions. Nexplanon was able to palpated in the patient's arm; patient palpated the insert herself.  There was minimal blood loss. Patient insertion site covered with gauze and a pressure bandage to reduce any bruising.   The patient tolerated the procedure well and was given post procedure instructions.  She was advised to have backup contraception for one week.      Radene Gunning, MD, [place] [facility], [place]

## 2022-05-29 ENCOUNTER — Ambulatory Visit (INDEPENDENT_AMBULATORY_CARE_PROVIDER_SITE_OTHER): Payer: BC Managed Care – PPO | Admitting: Advanced Practice Midwife

## 2022-05-29 ENCOUNTER — Other Ambulatory Visit (HOSPITAL_COMMUNITY)
Admission: RE | Admit: 2022-05-29 | Discharge: 2022-05-29 | Disposition: A | Discharge status: Home / Self Care | Payer: BC Managed Care – PPO | Source: Ambulatory Visit | Attending: Advanced Practice Midwife | Admitting: Advanced Practice Midwife

## 2022-05-29 ENCOUNTER — Encounter: Payer: Self-pay | Admitting: Advanced Practice Midwife

## 2022-05-29 VITALS — BP 118/84 | HR 84 | Ht 65.0 in | Wt 164.0 lb

## 2022-05-29 DIAGNOSIS — Z975 Presence of (intrauterine) contraceptive device: Secondary | ICD-10-CM | POA: Diagnosis not present

## 2022-05-29 DIAGNOSIS — Z3046 Encounter for surveillance of implantable subdermal contraceptive: Secondary | ICD-10-CM

## 2022-05-29 DIAGNOSIS — Z113 Encounter for screening for infections with a predominantly sexual mode of transmission: Secondary | ICD-10-CM | POA: Insufficient documentation

## 2022-05-29 DIAGNOSIS — Z01419 Encounter for gynecological examination (general) (routine) without abnormal findings: Secondary | ICD-10-CM | POA: Insufficient documentation

## 2022-05-29 MED ORDER — MEGESTROL ACETATE 40 MG PO TABS: ORAL_TABLET | ORAL | 1 refills | Status: DC

## 2022-05-29 NOTE — Assessment & Plan Note (Signed)
Switched out 10/23

## 2022-05-29 NOTE — Progress Notes (Signed)
[name] Wood 31 y.o.  Vitals:   05/29/22 0930  BP: 118/84  Pulse: 84     Filed Weights   05/29/22 0930  Weight: 164 lb (74.4 kg)    Past Medical History: Past Medical History:  Diagnosis Date   Chlamydia 04/03/2018   History of chlamydia    Pt and partner was treated.   Trichimoniasis 12/18/2018   6/3 treated     Past Surgical History: History reviewed. No pertinent surgical history.  Family History: Family History  Problem Relation Age of Onset   Lupus Maternal Grandmother     Social History: Social History   Tobacco Use   Smoking status: Every Day    Packs/day: 0.25    Types: Cigarettes   Smokeless tobacco: Never  Vaping Use   Vaping Use: Never used  Substance Use Topics   Alcohol use: No   Drug use: No    Allergies: No Known Allergies    Current Outpatient Medications:    etonogestrel (NEXPLANON) 68 MG IMPL implant, 1 each by Subdermal route once., Disp: , Rfl:    megestrol (MEGACE) 40 MG tablet, Take 2 daily for now, Disp: 60 tablet, Rfl: 1  History of Present Illness: Here for pap and physical.  Uses nexplanon for [facility], just had it switched out last month  Bleeding went on for 3 weeks, now stopped.  Uses megace PRN  Prior cytology:  Date Result Procedure  12/16/18 NILM w/ HRHPV not done None  11/19/15 NILM w/ HRHPV not done None  10/14/13 NILM w/ HRHPV not done None        Review of Systems   Patient denies any headaches, blurred vision, shortness of breath, chest pain, abdominal pain, problems with bowel movements, urination, or intercourse.   Physical Exam: General:  Well developed, well nourished, no acute distress Skin:  Warm and dry Neck:  Midline trachea, normal thyroid Lungs; Clear to auscultation bilaterally Breast:  No dominant palpable mass, retraction, or nipple discharge Cardiovascular: Regular rate and rhythm Abdomen:  Soft, non tender, no hepatosplenomegaly Pelvic:  External genitalia is normal in appearance.  The vagina is  normal in appearance.  The cervix is bulbous.  Uterus is felt to be normal size, shape, and contour.  No adnexal masses or tenderness noted.  Extremities:  No swelling or varicosities noted Psych:  No mood changes.     Impression: Normal GYN exam     Plan: if normal, repeat in 3 years.

## 2022-05-30 LAB — RPR: RPR Ser Ql: NONREACTIVE

## 2022-05-30 LAB — HIV ANTIBODY (ROUTINE TESTING W REFLEX): HIV Screen 4th Generation wRfx: NONREACTIVE

## 2022-05-30 LAB — HEPATITIS B SURFACE ANTIGEN: Hepatitis B Surface Ag: NEGATIVE

## 2022-06-01 ENCOUNTER — Other Ambulatory Visit: Payer: Self-pay | Admitting: Advanced Practice Midwife

## 2022-06-01 ENCOUNTER — Encounter: Payer: Self-pay | Admitting: Advanced Practice Midwife

## 2022-06-01 DIAGNOSIS — Z3046 Encounter for surveillance of implantable subdermal contraceptive: Secondary | ICD-10-CM

## 2022-06-01 LAB — CYTOLOGY - PAP
Chlamydia: NEGATIVE
Comment: NEGATIVE
Comment: NEGATIVE
Comment: NORMAL
Diagnosis: UNDETERMINED — AB
High risk HPV: NEGATIVE
Neisseria Gonorrhea: NEGATIVE

## 2022-06-01 MED ORDER — MEGESTROL ACETATE 40 MG PO TABS: ORAL_TABLET | ORAL | 1 refills | Status: AC

## 2022-06-01 MED ORDER — METRONIDAZOLE 500 MG PO TABS: 500.0000 mg | ORAL_TABLET | Freq: Two times a day (BID) | ORAL | 0 refills | Status: AC

## 2022-10-12 ENCOUNTER — Encounter: Payer: Self-pay | Admitting: Advanced Practice Midwife

## 2022-10-12 ENCOUNTER — Other Ambulatory Visit: Payer: Self-pay | Admitting: Adult Health

## 2022-10-12 DIAGNOSIS — Z202 Contact with and (suspected) exposure to infections with a predominantly sexual mode of transmission: Secondary | ICD-10-CM

## 2022-10-12 MED ORDER — DOXYCYCLINE HYCLATE 100 MG PO TABS: 100.0000 mg | ORAL_TABLET | Freq: Two times a day (BID) | ORAL | 0 refills | Status: AC

## 2022-10-12 NOTE — Progress Notes (Signed)
Will rx doxycycline for +chlamydia contact
